# Patient Record
Sex: Male | Born: 1975
Health system: Southern US, Community
[De-identification: ages and names within clinical notes are randomized; demographics above are authoritative.]

## PROBLEM LIST (undated history)

## (undated) DIAGNOSIS — F410 Panic disorder [episodic paroxysmal anxiety] without agoraphobia: Secondary | ICD-10-CM

## (undated) DIAGNOSIS — G43909 Migraine, unspecified, not intractable, without status migrainosus: Secondary | ICD-10-CM

## (undated) DIAGNOSIS — N200 Calculus of kidney: Secondary | ICD-10-CM

## (undated) DIAGNOSIS — F419 Anxiety disorder, unspecified: Secondary | ICD-10-CM

## (undated) DIAGNOSIS — K219 Gastro-esophageal reflux disease without esophagitis: Secondary | ICD-10-CM

## (undated) HISTORY — PX: APPENDECTOMY: SHX54

## (undated) HISTORY — DX: Anxiety disorder, unspecified: F41.9

## (undated) HISTORY — DX: Calculus of kidney: N20.0

## (undated) HISTORY — PX: CYST REMOVAL NECK: SHX6281

## (undated) HISTORY — PX: CYST REMOVAL HAND: SHX6279

## (undated) HISTORY — PX: LACERATION REPAIR: SHX5168

## (undated) HISTORY — DX: Migraine, unspecified, not intractable, without status migrainosus: G43.909

## (undated) HISTORY — DX: Gastro-esophageal reflux disease without esophagitis: K21.9

---

## 2000-03-31 ENCOUNTER — Emergency Department (HOSPITAL_COMMUNITY): Admission: EM | Admit: 2000-03-31 | Discharge: 2000-03-31 | Payer: Self-pay | Admitting: Emergency Medicine

## 2000-04-01 ENCOUNTER — Encounter: Payer: Self-pay | Admitting: Emergency Medicine

## 2000-08-27 ENCOUNTER — Encounter: Payer: Self-pay | Admitting: Emergency Medicine

## 2000-08-27 ENCOUNTER — Emergency Department (HOSPITAL_COMMUNITY): Admission: EM | Admit: 2000-08-27 | Discharge: 2000-08-27 | Payer: Self-pay | Admitting: Emergency Medicine

## 2002-01-02 ENCOUNTER — Emergency Department (HOSPITAL_COMMUNITY): Admission: EM | Admit: 2002-01-02 | Discharge: 2002-01-02 | Payer: Self-pay | Admitting: Emergency Medicine

## 2003-09-19 ENCOUNTER — Encounter (INDEPENDENT_AMBULATORY_CARE_PROVIDER_SITE_OTHER): Payer: Self-pay | Admitting: Specialist

## 2003-09-19 ENCOUNTER — Observation Stay (HOSPITAL_COMMUNITY): Admission: EM | Admit: 2003-09-19 | Discharge: 2003-09-20 | Payer: Self-pay | Admitting: Emergency Medicine

## 2007-02-16 ENCOUNTER — Ambulatory Visit (HOSPITAL_COMMUNITY): Admission: RE | Admit: 2007-02-16 | Discharge: 2007-02-16 | Payer: Self-pay | Admitting: Orthopedic Surgery

## 2008-04-16 ENCOUNTER — Emergency Department (HOSPITAL_COMMUNITY): Admission: EM | Admit: 2008-04-16 | Discharge: 2008-04-16 | Payer: Self-pay | Admitting: Emergency Medicine

## 2009-01-24 ENCOUNTER — Other Ambulatory Visit: Admission: RE | Admit: 2009-01-24 | Discharge: 2009-01-24 | Payer: Self-pay | Admitting: Otolaryngology

## 2009-02-09 ENCOUNTER — Encounter: Admission: RE | Admit: 2009-02-09 | Discharge: 2009-02-09 | Payer: Self-pay | Admitting: Family Medicine

## 2010-08-02 ENCOUNTER — Encounter
Admission: RE | Admit: 2010-08-02 | Discharge: 2010-08-02 | Payer: Self-pay | Source: Home / Self Care | Attending: Internal Medicine | Admitting: Internal Medicine

## 2010-10-31 ENCOUNTER — Other Ambulatory Visit: Payer: Self-pay | Admitting: Physical Medicine and Rehabilitation

## 2010-10-31 DIAGNOSIS — M545 Low back pain, unspecified: Secondary | ICD-10-CM

## 2010-10-31 DIAGNOSIS — G8929 Other chronic pain: Secondary | ICD-10-CM

## 2010-11-01 ENCOUNTER — Other Ambulatory Visit: Payer: Self-pay | Admitting: Physical Medicine and Rehabilitation

## 2010-11-01 DIAGNOSIS — Z139 Encounter for screening, unspecified: Secondary | ICD-10-CM

## 2010-11-04 ENCOUNTER — Ambulatory Visit
Admission: RE | Admit: 2010-11-04 | Discharge: 2010-11-04 | Disposition: A | Discharge status: Home / Self Care | Payer: BC Managed Care – PPO | Source: Ambulatory Visit | Attending: Physical Medicine and Rehabilitation | Admitting: Physical Medicine and Rehabilitation

## 2010-11-04 ENCOUNTER — Other Ambulatory Visit: Payer: Self-pay

## 2010-11-04 DIAGNOSIS — M545 Low back pain, unspecified: Secondary | ICD-10-CM

## 2010-11-04 DIAGNOSIS — G8929 Other chronic pain: Secondary | ICD-10-CM

## 2010-11-04 DIAGNOSIS — Z139 Encounter for screening, unspecified: Secondary | ICD-10-CM

## 2011-01-10 NOTE — Op Note (Signed)
NAME:  EASHAN, SCHIPANI                         ACCOUNT NO.:  [id number]   MEDICAL RECORD NO.:  [id number]                   PATIENT TYPE:  INP   LOCATION:  0103                                 FACILITY:  [facility]   PHYSICIAN:  Abigail Miyamoto, M.D.              DATE OF BIRTH:  Aug 25, 1976   DATE OF PROCEDURE:  09/19/2003  DATE OF DISCHARGE:                                 OPERATIVE REPORT   PREOPERATIVE DIAGNOSIS:  Acute appendicitis.   POSTOPERATIVE DIAGNOSIS:  Acute appendicitis.   OPERATION/PROCEDURE:  Laparoscopic appendectomy.   SURGEON:  Abigail Miyamoto, M.D.   ANESTHESIA:  General endotracheal anesthesia and 0.25% Marcaine.   ESTIMATED BLOOD LOSS:  Minimal.   INDICATIONS:  Devesh Monforte is a 35 year old gentleman who presented with 30  day history of right lower quadrant abdominal pain.  He was found to be  tender on physical examination.  CT scan of the abdomen revealed an  appendicolith and a dilated appendix with mild periappendiceal inflammation.   FINDINGS:  The patient was indeed found to have acute appendicitis with  appendicolith.  There was no evidence of perforation.   DESCRIPTION OF PROCEDURE:  The patient was brought to the operating room,  identified as Jordan Hawks.  He was placed supine on the operating room  table and general anesthesia was induced.  His abdomen was then prepped and  draped in the usual sterile fashion.  Using a #15 blade, a small transverse  incision was made below the umbilicus.  The incision was carried down to the  fascia which was then opened with the scalpel.  Hemostat was used to pass  into the peritoneal cavity.  Next, an 0 Vicryl pursestring was placed around  the fascial opening.  The Hasson port was placed through the opening and  insufflation of the abdomen was begun.  The 5 mm port was then placed in the  patient's right upper quadrant under direct vision.  A 12 mm port was then  placed in the patient's midline at the pubis.   This was done under direct  vision as well.  The appendix was found to be acutely inflamed with an  appendicolith at the base.  Minimal mobilization was needed to identify the  appendix.  The terminal ileum was stuck to the appendix and had to be  dissected free with both blunt dissection and the Harmonic scalpel.  The  mesoappendix was then taken down with the Harmonic scalpel.  Hemostasis  appeared to be achieved.  The base of the appendix was then easily  identified and transected with endoscopic GIA stapler.  Once the appendix  was transected, it was placed in an endosac and removed through the incision  at the umbilicus.  The appendiceal base was examined and the staple line was  found to have a small amount of bleeding which was controlled with the  electrocautery.  The right lower quadrant was then  thoroughly irrigated with  normal saline.  Hemostasis appeared to be achieved.  At this point all ports  were removed under direct vision and the abdomen was deflated.  The 0 Vicryl  was tied in place at the umbilicus closing the fascial defect.  A separate 0  Vicryl suture was placed at the fascial defect at the suprapubic port.  All  incisions had been anesthetized with 0.25% Marcaine and closed with 4-0  Monocryl subcuticular sutures.  Steri-Strips, gauze and tape were then  applied.  The patient tolerated the procedure well.  All sponge, needle and  instrument counts were correct at the end of the procedure.  The patient was  then extubated in the operating room and taken in stable condition to the  recovery room.                                               Abigail Miyamoto, M.D.    DB/MEDQ  D:  09/19/2003  T:  09/20/2003  Job:  [id number]

## 2011-01-10 NOTE — H&P (Signed)
NAME:  Ralph Lang, Ralph Lang                         ACCOUNT NO.:  [id number]   MEDICAL RECORD NO.:  [id number]                   PATIENT TYPE:  EMS   LOCATION:  ED                                   FACILITY:  [facility]   PHYSICIAN:  Abigail Miyamoto, M.D.              DATE OF BIRTH:  1976-03-05   DATE OF ADMISSION:  09/19/2003  DATE OF DISCHARGE:                                HISTORY & PHYSICAL   CHIEF COMPLAINT:  Abdominal pain.   HISTORY:  Mr. Ralph Lang is a 35 year old gentleman who presented to  physician's office with right lower quadrant abdominal pain which had been  occurring for three days.  It hurt worse with motion.  He has had some hot  flashes today but no real fevers or chills.  He has had no nausea or  vomiting.  He is moving his bowels well and passing his urine well.  He  denies chest pain or shortness of breath as well.  He describes the pain as  sharp and in the right lower quadrant.   PAST MEDICAL HISTORY:  1. Migraine headaches.  2. Depression.   MEDICATIONS:  1. Migraine headache medication.  2. Xanax.   ALLERGIES:  He has no known drug allergies.   PREVIOUS SURGICAL HISTORY:  Tendon repair on both wrists.   SOCIAL HISTORY:  He smokes a pack of cigarettes a day and drinks alcohol on  occasion.   REVIEW OF SYSTEMS:  Otherwise negative.   PHYSICAL EXAMINATION:  GENERAL:  Reveals a well-developed, well-nourished  gentleman in no acute distress.  He is well in appearance.  VITAL SIGNS:  Temperature is 97.6, blood pressure 139/80, pulse is 88,  respiratory rate is 20.  EYES:  He is anicteric.  Pupils are reactive bilaterally.  EARS/NOSE/MOUTH AND THROAT:  External ears and nose are normal.  Hearing is  normal.  Oropharynx is clear.  NECK:  Supple.  There is no cervical adenopathy.  There is no thyromegaly.  LUNGS:  Clear to auscultation bilaterally with normal respiratory effort.  CARDIOVASCULAR:  Regular, rate and rhythm.  There are no murmurs.  There is  no peripheral edema.  ABDOMEN:  Soft.  There is tenderness which is quite severe with guarding in  the right lower quadrant.  There is rebound.  There are no masses.  There  are no hernias.  There is no organomegaly.  EXTREMITIES:  Warm and well perfused.  There is no edema, clubbing or  cyanosis.   LABORATORY DATA:  The patient has a white blood count of 11.7 with 63%  neutrophils, hemoglobin is 15.3, platelets are 158.  Sodium and potassium  are 139 and 3.6.  BUN and creatinine are 12 and 0.9.   The patient had a CAT scan of the abdomen and pelvis which showed an  appendicolith with a dilated appendix and __________ inflammation consistent  with acute appendicitis.   IMPRESSION:  This  is a 35 year old gentleman with probable acute  appendicitis.   PLAN:  At this point would be to admit him to the hospital and take him to  the operating room for appendectomy.  I discussed the procedure with him in  detail.  We will try a laparoscopic technique.  I have discussed the risks  of surgery including bleeding, infection, need for conversion to open  surgery, etcetera.  At this point, he wishes to proceed.  Surgery must be  performed emergently.                                               Abigail Miyamoto, M.D.    DB/MEDQ  D:  09/19/2003  T:  09/19/2003  Job:  [id number]

## 2011-06-02 ENCOUNTER — Observation Stay (HOSPITAL_COMMUNITY)
Admission: EM | Admit: 2011-06-02 | Discharge: 2011-06-03 | Disposition: A | Discharge status: Home / Self Care | Payer: BC Managed Care – PPO | Attending: Cardiology | Admitting: Cardiology

## 2011-06-02 ENCOUNTER — Emergency Department (HOSPITAL_COMMUNITY): Payer: BC Managed Care – PPO

## 2011-06-02 DIAGNOSIS — F411 Generalized anxiety disorder: Secondary | ICD-10-CM | POA: Insufficient documentation

## 2011-06-02 DIAGNOSIS — G43909 Migraine, unspecified, not intractable, without status migrainosus: Secondary | ICD-10-CM | POA: Insufficient documentation

## 2011-06-02 DIAGNOSIS — R110 Nausea: Secondary | ICD-10-CM | POA: Insufficient documentation

## 2011-06-02 DIAGNOSIS — R079 Chest pain, unspecified: Secondary | ICD-10-CM

## 2011-06-02 DIAGNOSIS — R0609 Other forms of dyspnea: Secondary | ICD-10-CM | POA: Insufficient documentation

## 2011-06-02 DIAGNOSIS — R0789 Other chest pain: Principal | ICD-10-CM | POA: Insufficient documentation

## 2011-06-02 DIAGNOSIS — R0989 Other specified symptoms and signs involving the circulatory and respiratory systems: Secondary | ICD-10-CM | POA: Insufficient documentation

## 2011-06-02 DIAGNOSIS — F17200 Nicotine dependence, unspecified, uncomplicated: Secondary | ICD-10-CM | POA: Insufficient documentation

## 2011-06-02 DIAGNOSIS — E785 Hyperlipidemia, unspecified: Secondary | ICD-10-CM | POA: Insufficient documentation

## 2011-06-02 DIAGNOSIS — D72829 Elevated white blood cell count, unspecified: Secondary | ICD-10-CM | POA: Insufficient documentation

## 2011-06-02 LAB — CBC
HCT: 42.6 % (ref 39.0–52.0)
MCHC: 35.9 g/dL (ref 30.0–36.0)
RDW: 12.3 % (ref 11.5–15.5)

## 2011-06-02 LAB — CARDIAC PANEL(CRET KIN+CKTOT+MB+TROPI)
CK, MB: 2 ng/mL (ref 0.3–4.0)
Relative Index: INVALID (ref 0.0–2.5)
Total CK: 71 U/L (ref 7–232)
Troponin I: <0.3 ng/mL (ref <0.30)

## 2011-06-02 LAB — HEPATIC FUNCTION PANEL
Alkaline Phosphatase: 78 U/L (ref 39–117)
Bilirubin, Direct: <0.1 mg/dL (ref 0.0–0.3)
Total Bilirubin: 0.2 mg/dL — ABNORMAL LOW (ref 0.3–1.2)
Total Protein: 6.8 g/dL (ref 6.0–8.3)

## 2011-06-02 LAB — DIFFERENTIAL
Basophils Absolute: 0 10*3/uL (ref 0.0–0.1)
Lymphs Abs: 2.7 10*3/uL (ref 0.7–4.0)
Monocytes Absolute: 0.9 10*3/uL (ref 0.1–1.0)

## 2011-06-02 LAB — BASIC METABOLIC PANEL
CO2: 26 mEq/L (ref 19–32)
Calcium: 9.6 mg/dL (ref 8.4–10.5)
Creatinine, Ser: 0.83 mg/dL (ref 0.50–1.35)

## 2011-06-02 LAB — POCT I-STAT TROPONIN I: Troponin i, poc: 0 ng/mL (ref 0.00–0.08)

## 2011-06-02 LAB — LIPASE, BLOOD: Lipase: 33 U/L (ref 11–59)

## 2011-06-02 LAB — HEMOGLOBIN A1C: Mean Plasma Glucose: 108 mg/dL (ref <117)

## 2011-06-02 LAB — CK TOTAL AND CKMB (NOT AT ARMC): Relative Index: INVALID (ref 0.0–2.5)

## 2011-06-03 LAB — CBC
MCHC: 35.2 g/dL (ref 30.0–36.0)
Platelets: 160 10*3/uL (ref 150–400)
RDW: 12.3 % (ref 11.5–15.5)

## 2011-06-03 LAB — BASIC METABOLIC PANEL
BUN: 17 mg/dL (ref 6–23)
CO2: 26 mEq/L (ref 19–32)
Calcium: 9.3 mg/dL (ref 8.4–10.5)
GFR calc non Af Amer: >90 mL/min (ref >90)
Glucose, Bld: 92 mg/dL (ref 70–99)

## 2011-06-03 LAB — LIPID PANEL
LDL Cholesterol: UNDETERMINED mg/dL (ref 0–99)
VLDL: UNDETERMINED mg/dL (ref 0–40)

## 2011-06-03 LAB — CARDIAC PANEL(CRET KIN+CKTOT+MB+TROPI)
CK, MB: 2.1 ng/mL (ref 0.3–4.0)
Total CK: 70 U/L (ref 7–232)

## 2011-06-08 NOTE — H&P (Addendum)
Ralph Lang, Ralph Lang NO.:  [id number]  MEDICAL RECORD NO.:  [id number]  LOCATION:  3705                         FACILITY:  [facility]  PHYSICIAN:  Madolyn Frieze. Jens Som, MD, FACCDATE OF BIRTH:  Jul 22, 1976  DATE OF ADMISSION:  06/02/2011 DATE OF DISCHARGE:                             HISTORY & PHYSICAL   PRIMARY CARDIOLOGIST:  New.  The patient remotely saw [facility], records are unavailable.  PRIMARY MEDICAL DOCTOR:  Dr. Evelene Croon manages his anxiety.  CHIEF COMPLAINT:  Chest pain.  HISTORY OF PRESENT ILLNESS:  Ralph Lang is a 35 year old gentleman with no prior cardiac history but history of anxiety and migraine headaches who woke up this morning with his whole chest hurting with associated left arm pain.  He felt somewhat lightheaded, oozy, and nauseous.  He describes the pain as an aching sensation, and it was not associated with any shortness of breath or diaphoresis.  However, he did feel a sense of increased anxiety.  He has had this before, in fact on a regular basis 1-2 times a week with no particular relation to exertion. He works in the [employer] and denies any chest pain or shortness of breath and doing physical labor.  The pain typically lasts 30-45 minutes, usually occurs after lunch when starting back at work.  They can also happen while lying around the house.  It is relieved by calming himself and taking Xanax.  He went to [facility] this morning and due to his complaints of chest pain, was sent to the ER.  Sublingual nitroglycerin brought his pain down.  He also received Zofran that helped.  He is currently pain free and without complaints.  EKG demonstrate nonspecific ST-T changes, not particularly changed from prior.  Pain is not positional or worse with inspiration.  PAST MEDICAL HISTORY: 1. Migraine headaches, under good control, followed in the headache     clinic. 2. Anxiety. 3. Chest pain evaluation with Holter for 1 week at  [facility]     [facility] per patient, within normal limits. 4. Ongoing tobacco use. 5. 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  SURGICAL HISTORY:  Appendectomy.  MEDICATIONS: 1. Cymbalta 30 mg daily. 2. Xanax 2 mg t.i.d. p.r.n.  ALLERGIES:  No known drug allergies.  SOCIAL HISTORY:  Ralph Lang lives in [place].  He is married without any children.  He works for [employer].  He has smoked since age 73.  He does not get any regular exercise but does work laborious job and rides 4 wheelers without any symptoms.  He denies any alcohol or drug use.  FAMILY HISTORY:  Positive for hypertension.  There is no history of coronary artery disease in his family.  His father and siblings are in good health.  REVIEW OF SYSTEMS:  No fever, shortness of breath, edema, palpitations, syncope, vomiting, bright red blood per rectum, melena, or hematemesis. All other systems were reviewed and otherwise negative.  LABORATORY DATA:  WBC 12.7, hemoglobin 15.3, hematocrit 42.6, platelet count 142,000.  Troponin is negative.  BMET is pending.  RADIOLOGIC STUDIES: 1. Chest x-ray:  No acute cardiopulmonary  process. 2. EKG:  Normal sinus rhythm with diffuse ST elevation, question     repolarization abnormality in II, aVF, V3 through V6 similar in     2003 except now upright in V2.  PHYSICAL EXAMINATION:  VITAL SIGNS:  Temperature 98, pulse 75, respirations 16, blood pressure 119/72, pulse ox 97% on room air. GENERAL:  This is a pleasant white male in no acute distress. HEENT:  Normocephalic, atraumatic with extraocular movements intact. Clear sclerae.  Nares are without discharge. NECK:  Supple without carotid bruits. HEART:  Auscultation of the heart reveals regular rate and rhythm with S1, S2 without murmurs, rubs, or gallops. LUNGS:  Clear to auscultation bilaterally without wheezes, rales, or rhonchi. ABDOMEN:  Soft, nontender,  nondistended.  Positive bowel sounds. EXTREMITIES:  Warm and dry without edema.  He has 2+ pedal pulses bilaterally. NEUROLOGIC:  He is alert and oriented x3.  Responds to questions appropriately.  ASSESSMENT/PLAN:  The patient was seen and examined by Dr. Jens Som and myself.  Ralph Lang is a 35 year old gentleman with a history of anxiety and tobacco abuse who presents with an episode of chest pain for the past 6-7 years, averaging twice per week, substernal with radiation to the left upper extremity with associated nausea, shortness of breath, and diaphoresis.  He has an increased incidence with stressful situations and it is typically relieved with Xanax.  He did get relief from nitroglycerin en route to the ER.  He is presently pain-free. Chest pain is felt atypical, possibly related to anxiety at this time. At this time, we will admit him to the hospital and rule out myocardial infarction.  If his enzymes remain negative, we will plan for an outpatient stress test.  He has been counseled regarding his tobacco discontinuation.  The significance of his white blood cell count is not known at this time.  Given his history of prior lymph node enlargement, he may benefit from outpatient followup closely at discharge.  We will recheck CBC in the morning.     Dayna Dunn, P.A.C.   ______________________________ Madolyn Frieze. Jens Som, MD, [facility]    DD/MEDQ  D:  06/02/2011  T:  06/02/2011  Job:  [id number]  cc:   Madolyn Frieze. Jens Som, MD, [facility] Milagros Evener, M.D.  Electronically Signed by Ronie Spies  on 06/08/2011 03:10:42 PM Electronically Signed by Olga Millers MD [facility] on 06/08/2011 05:37:17 PM

## 2011-06-08 NOTE — Discharge Summary (Addendum)
[name], EMPEY NO.:  [id number]  MEDICAL RECORD NO.:  [id number]  LOCATION:  3705                         FACILITY:  [facility]  PHYSICIAN:  Madolyn Frieze. Jens Som, MD, FACCDATE OF BIRTH:  1976-07-09  DATE OF ADMISSION:  06/02/2011 DATE OF DISCHARGE:  06/03/2011                              DISCHARGE SUMMARY   DISCHARGE DIAGNOSES: 1. Chest pain, felt atypical.     a.     Ruled out for myocardial infarction.     b.     For exercise treadmill test next week. 2. Dyslipidemia with elevated triglycerides and low HDL at 30. 3. Anxiety. 4. Ongoing tobacco abuse. 5. Migraine headaches, under control, followed by the [facility]. 6. Leukocytosis of unclear etiology.     a.     The patient has a history of lymph node biopsy in 2010,      instructed to follow up with PCP for recheck. 7. Status post appendectomy.  HOSPITAL COURSE:  Mr. Ralph Lang is a 35 year old gentleman with no prior history of coronary artery disease with a history of anxiety and migraine headaches who has complained of intermittent chest pain 1-2 times a week for the last several years.  He was asleep, when he woke up he had a sensation that his whole chest was hurting with associated left arm pain.  He denied any shortness of breath or any exertional symptoms. Typically when he has this discomfort, it lasts 30-45 minutes after lunch when starting back at work.  It is relieved by Xanax.  In the ER, it was possibly relieved by nitroglycerin.  EKG was without acute changes and cardiac enzymes were negative.  He was admitted to the hospital for rule out and cardiac markers remained negative.  He did have mild leukocytosis of 12.7 that came down to 11.6 with no obvious source of infection.  His chest pain was felt largely atypical, possibly related to anxiety.  On day of discharge, he is feeling better.  Dr. Jens Som has seen and examined him today and feels he is stable for discharge with an ETT and  followup with his PCP as an outpatient.  DISCHARGE LABORATORY DATA:  WBC 11.6, hemoglobin 13.8, hematocrit 44.9, and platelet count 160.  Sodium 138, potassium 4.2, chloride 103, CO2 26, glucose 92, BUN 17, and creatinine 1.  LFTs were normal.  Lipase is normal.  Cardiac enzymes negative x4.  Total cholesterol 207, triglycerides 5 with HDL 30, LDL not calculated.  TSH 1.050.  STUDIES:  Chest x-ray, June 02, 2011, showed no acute cardiopulmonary process.  DISCHARGE MEDICATIONS: 1. Aspirin 81 mg daily. 2. Cymbalta 30 mg every morning. 3. Xanax 2 mg t.i.d. p.r.n. anxiety.  DISPOSITION:  Mr. Ralph Lang will be discharged in stable condition to home.  He is to increase activity slowly.  Dr. Jens Som would like him to pay his particular attention to his diet given his elevated triglyceride level.  He will have an ETT at [facility] on June 12, 2011 at 2:00 p.m.  He is also to follow up with his PCP for a recheck of his white blood cell count given his history of lymph node biopsy.  DURATION OF  DISCHARGE ENCOUNTER:  Greater than 30 minutes including physician and PA time.     Dayna Dunn, P.A.C.   ______________________________ Madolyn Frieze. Jens Som, MD, [facility]    DD/MEDQ  D:  06/03/2011  T:  06/03/2011  Job:  [id number]  Electronically Signed by Ronie Spies  on 06/08/2011 03:10:45 PM Electronically Signed by Olga Millers MD [facility] on 06/08/2011 05:37:20 PM

## 2011-06-12 ENCOUNTER — Encounter: Payer: BC Managed Care – PPO | Admitting: Physician Assistant

## 2013-07-26 ENCOUNTER — Ambulatory Visit: Payer: No Typology Code available for payment source

## 2013-07-26 ENCOUNTER — Ambulatory Visit (INDEPENDENT_AMBULATORY_CARE_PROVIDER_SITE_OTHER): Payer: No Typology Code available for payment source | Admitting: Internal Medicine

## 2013-07-26 VITALS — BP 116/74 | HR 69 | Temp 98.4°F | Resp 17 | Ht 72.0 in | Wt 164.0 lb

## 2013-07-26 DIAGNOSIS — F17200 Nicotine dependence, unspecified, uncomplicated: Secondary | ICD-10-CM

## 2013-07-26 DIAGNOSIS — R5381 Other malaise: Secondary | ICD-10-CM

## 2013-07-26 DIAGNOSIS — F419 Anxiety disorder, unspecified: Secondary | ICD-10-CM

## 2013-07-26 DIAGNOSIS — M542 Cervicalgia: Secondary | ICD-10-CM

## 2013-07-26 DIAGNOSIS — G43909 Migraine, unspecified, not intractable, without status migrainosus: Secondary | ICD-10-CM

## 2013-07-26 DIAGNOSIS — R0789 Other chest pain: Secondary | ICD-10-CM

## 2013-07-26 DIAGNOSIS — F411 Generalized anxiety disorder: Secondary | ICD-10-CM

## 2013-07-26 DIAGNOSIS — R071 Chest pain on breathing: Secondary | ICD-10-CM

## 2013-07-26 LAB — COMPREHENSIVE METABOLIC PANEL
ALT: 16 U/L (ref 0–53)
AST: 18 U/L (ref 0–37)
Albumin: 4.6 g/dL (ref 3.5–5.2)
Alkaline Phosphatase: 66 U/L (ref 39–117)
Calcium: 10.1 mg/dL (ref 8.4–10.5)
Chloride: 106 mEq/L (ref 96–112)
Potassium: 4.6 mEq/L (ref 3.5–5.3)

## 2013-07-26 LAB — POCT CBC
Hemoglobin: 15 g/dL (ref 14.1–18.1)
Lymph, poc: 3.8 — AB (ref 0.6–3.4)
MCHC: 31.7 g/dL — AB (ref 31.8–35.4)
MID (cbc): 0.8 (ref 0–0.9)
MPV: 11.7 fL (ref 0–99.8)
POC Granulocyte: 8.2 — AB (ref 2–6.9)
POC LYMPH PERCENT: 29.9 %L (ref 10–50)
POC MID %: 5.9 %M (ref 0–12)
Platelet Count, POC: 153 10*3/uL (ref 142–424)
RDW, POC: 12.8 %

## 2013-07-26 LAB — VITAMIN B12: Vitamin B-12: 602 pg/mL (ref 211–911)

## 2013-07-26 LAB — POCT URINALYSIS DIPSTICK
Leukocytes, UA: NEGATIVE
Nitrite, UA: NEGATIVE
Protein, UA: NEGATIVE
Urobilinogen, UA: 0.2
pH, UA: 6

## 2013-07-26 LAB — LIPID PANEL
HDL: 32 mg/dL — ABNORMAL LOW (ref >39)
LDL Cholesterol: 102 mg/dL — ABNORMAL HIGH (ref 0–99)
VLDL: 25 mg/dL (ref 0–40)

## 2013-07-26 LAB — POCT UA - MICROSCOPIC ONLY
Casts, Ur, LPF, POC: NEGATIVE
Crystals, Ur, HPF, POC: NEGATIVE
Yeast, UA: NEGATIVE

## 2013-07-26 MED ORDER — SUMATRIPTAN SUCCINATE 100 MG PO TABS: 100.0000 mg | ORAL_TABLET | ORAL | Status: DC | PRN

## 2013-07-26 MED ORDER — KETOROLAC TROMETHAMINE 60 MG/2ML IM SOLN
60.0000 mg | Freq: Once | INTRAMUSCULAR | Status: AC
  Administered 2013-07-26: 60 mg via INTRAMUSCULAR

## 2013-07-26 MED ORDER — AZITHROMYCIN 500 MG PO TABS: 500.0000 mg | ORAL_TABLET | Freq: Every day | ORAL | Status: DC

## 2013-07-26 MED ORDER — AMITRIPTYLINE HCL 25 MG PO TABS: 25.0000 mg | ORAL_TABLET | Freq: Every day | ORAL | Status: DC

## 2013-07-26 NOTE — Patient Instructions (Addendum)
Smoking Cessation Quitting smoking is important to your health and has many advantages. However, it is not always easy to quit since nicotine is a very addictive drug. Often times, people try 3 times or more before being able to quit. This document explains the best ways for you to prepare to quit smoking. Quitting takes hard work and a lot of effort, but you can do it. ADVANTAGES OF QUITTING SMOKING  You will live longer, feel better, and live better.  Your body will feel the impact of quitting smoking almost immediately.  Within 20 minutes, blood pressure decreases. Your pulse returns to its normal level.  After 8 hours, carbon monoxide levels in the blood return to normal. Your oxygen level increases.  After 24 hours, the chance of having a heart attack starts to decrease. Your breath, hair, and body stop smelling like smoke.  After 48 hours, damaged nerve endings begin to recover. Your sense of taste and smell improve.  After 72 hours, the body is virtually free of nicotine. Your bronchial tubes relax and breathing becomes easier.  After 2 to 12 weeks, lungs can hold more air. Exercise becomes easier and circulation improves.  The risk of having a heart attack, stroke, cancer, or lung disease is greatly reduced.  After 1 year, the risk of coronary heart disease is cut in half.  After 5 years, the risk of stroke falls to the same as a nonsmoker.  After 10 years, the risk of lung cancer is cut in half and the risk of other cancers decreases significantly.  After 15 years, the risk of coronary heart disease drops, usually to the level of a nonsmoker.  If you are pregnant, quitting smoking will improve your chances of having a healthy baby.  The people you live with, especially any children, will be healthier.  You will have extra money to spend on things other than cigarettes. QUESTIONS TO THINK ABOUT BEFORE ATTEMPTING TO QUIT You may want to talk about your answers with your  caregiver.  Why do you want to quit?  If you tried to quit in the past, what helped and what did not?  What will be the most difficult situations for you after you quit? How will you plan to handle them?  Who can help you through the tough times? Your family? Friends? A caregiver?  What pleasures do you get from smoking? What ways can you still get pleasure if you quit? Here are some questions to ask your caregiver:  How can you help me to be successful at quitting?  What medicine do you think would be best for me and how should I take it?  What should I do if I need more help?  What is smoking withdrawal like? How can I get information on withdrawal? GET READY  Set a quit date.  Change your environment by getting rid of all cigarettes, ashtrays, matches, and lighters in your home, car, or work. Do not let people smoke in your home.  Review your past attempts to quit. Think about what worked and what did not. GET SUPPORT AND ENCOURAGEMENT You have a better chance of being successful if you have help. You can get support in many ways.  Tell your family, friends, and co-workers that you are going to quit and need their support. Ask them not to smoke around you.  Get individual, group, or telephone counseling and support. Programs are available at local hospitals and health centers. Call your local health department for   information about programs in your area.  Spiritual beliefs and practices may help some smokers quit.  Download a "quit meter" on your computer to keep track of quit statistics, such as how long you have gone without smoking, cigarettes not smoked, and money saved.  Get a self-help book about quitting smoking and staying off of tobacco. LEARN NEW SKILLS AND BEHAVIORS  Distract yourself from urges to smoke. Talk to someone, go for a walk, or occupy your time with a task.  Change your normal routine. Take a different route to work. Drink tea instead of coffee.  Eat breakfast in a different place.  Reduce your stress. Take a hot bath, exercise, or read a book.  Plan something enjoyable to do every day. Reward yourself for not smoking.  Explore interactive web-based programs that specialize in helping you quit. GET MEDICINE AND USE IT CORRECTLY Medicines can help you stop smoking and decrease the urge to smoke. Combining medicine with the above behavioral methods and support can greatly increase your chances of successfully quitting smoking.  Nicotine replacement therapy helps deliver nicotine to your body without the negative effects and risks of smoking. Nicotine replacement therapy includes nicotine gum, lozenges, inhalers, nasal sprays, and skin patches. Some may be available over-the-counter and others require a prescription.  Antidepressant medicine helps people abstain from smoking, but how this works is unknown. This medicine is available by prescription.  Nicotinic receptor partial agonist medicine simulates the effect of nicotine in your brain. This medicine is available by prescription. Ask your caregiver for advice about which medicines to use and how to use them based on your health history. Your caregiver will tell you what side effects to look out for if you choose to be on a medicine or therapy. Carefully read the information on the package. Do not use any other product containing nicotine while using a nicotine replacement product.  RELAPSE OR DIFFICULT SITUATIONS Most relapses occur within the first 3 months after quitting. Do not be discouraged if you start smoking again. Remember, most people try several times before finally quitting. You may have symptoms of withdrawal because your body is used to nicotine. You may crave cigarettes, be irritable, feel very hungry, cough often, get headaches, or have difficulty concentrating. The withdrawal symptoms are only temporary. They are strongest when you first quit, but they will go away within  10 14 days. To reduce the chances of relapse, try to:  Avoid drinking alcohol. Drinking lowers your chances of successfully quitting.  Reduce the amount of caffeine you consume. Once you quit smoking, the amount of caffeine in your body increases and can give you symptoms, such as a rapid heartbeat, sweating, and anxiety.  Avoid smokers because they can make you want to smoke.  Do not let weight gain distract you. Many smokers will gain weight when they quit, usually less than 10 pounds. Eat a healthy diet and stay active. You can always lose the weight gained after you quit.  Find ways to improve your mood other than smoking. FOR MORE INFORMATION  www.smokefree.gov  Document Released: 08/05/2001 Document Revised: 02/10/2012 Document Reviewed: 11/20/2011 [employer] Patient Information 2014 [employer].  Migraine Headache A migraine headache is an intense, throbbing pain on one or both sides of your head. A migraine can last for 30 minutes to several hours. CAUSES  The exact cause of a migraine headache is not always known. However, a migraine may be caused when nerves in the brain become irritated and release chemicals   that cause inflammation. This causes pain. SYMPTOMS  Pain on one or both sides of your head.  Pulsating or throbbing pain.  Severe pain that prevents daily activities.  Pain that is aggravated by any physical activity.  Nausea, vomiting, or both.  Dizziness.  Pain with exposure to bright lights, loud noises, or activity.  General sensitivity to bright lights, loud noises, or smells. Before you get a migraine, you may get warning signs that a migraine is coming (aura). An aura may include:  Seeing flashing lights.  Seeing bright spots, halos, or zig-zag lines.  Having tunnel vision or blurred vision.  Having feelings of numbness or tingling.  Having trouble talking.  Having muscle weakness. MIGRAINE  TRIGGERS  Alcohol.  Smoking.  Stress.  Menstruation.  Aged cheeses.  Foods or drinks that contain nitrates, glutamate, aspartame, or tyramine.  Lack of sleep.  Chocolate.  Caffeine.  Hunger.  Physical exertion.  Fatigue.  Medicines used to treat chest pain (nitroglycerine), birth control pills, estrogen, and some blood pressure medicines. DIAGNOSIS  A migraine headache is often diagnosed based on:  Symptoms.  Physical examination.  A CT scan or MRI of your head. TREATMENT Medicines may be given for pain and nausea. Medicines can also be given to help prevent recurrent migraines.  HOME CARE INSTRUCTIONS  Only take over-the-counter or prescription medicines for pain or discomfort as directed by your caregiver. The use of long-term narcotics is not recommended.  Lie down in a dark, quiet room when you have a migraine.  Keep a journal to find out what may trigger your migraine headaches. For example, write down:  What you eat and drink.  How much sleep you get.  Any change to your diet or medicines.  Limit alcohol consumption.  Quit smoking if you smoke.  Get 7 to 9 hours of sleep, or as recommended by your caregiver.  Limit stress.  Keep lights dim if bright lights bother you and make your migraines worse. SEEK IMMEDIATE MEDICAL CARE IF:   Your migraine becomes severe.  You have a fever.  You have a stiff neck.  You have vision loss.  You have muscular weakness or loss of muscle control.  You start losing your balance or have trouble walking.  You feel faint or pass out.  You have severe symptoms that are different from your first symptoms. MAKE SURE YOU:   Understand these instructions.  Will watch your condition.  Will get help right away if you are not doing well or get worse. Document Released: 08/11/2005 Document Revised: 11/03/2011 Document Reviewed: 08/01/2011 [employer] Patient Information 2014 [employer].  

## 2013-07-26 NOTE — Progress Notes (Signed)
Subjective:    Patient ID: Ralph Lang, male    DOB: September 18, 1975, 37 y.o.   MRN: [id number]  HPI CO persistent migrain HA, has had migraines since childhood and had full neuro eval by the headache center years ago with head scan. They no longer take his insurance, used to get imitrex shots at their office. Is a smoker Also co chest pain radiating into right neck, fhx of uncle with cabg and caritid disease. Works with [employer], uses lasers to cut, lots of fumes. Has migrain with aura, visual.  Sees Dr. Evelene Croon for anxiety and takes 2mg  hs nitely, she ordered many labs for w/up of his farigue Stat EKG is normal  Review of Systems     Objective:   Physical Exam  Vitals reviewed. Constitutional: He is oriented to person, place, and time. He appears well-developed and well-nourished. No distress.  Cardiovascular: Normal rate, regular rhythm, normal heart sounds and intact distal pulses.   No murmur heard. Pulmonary/Chest: Effort normal and breath sounds normal.  Abdominal: Soft.  Neurological: He is alert and oriented to person, place, and time. He has normal reflexes. He displays tremor. He displays no atrophy and normal reflexes. No cranial nerve deficit or sensory deficit. He exhibits normal muscle tone. He displays a negative Romberg sign. Coordination and gait normal. He displays no Babinski's sign on the right side. He displays no Babinski's sign on the left side.  Reflex Scores:      Patellar reflexes are 2+ on the right side and 2+ on the left side.      Achilles reflexes are 2+ on the right side and 2+ on the left side. No drift  Balance excellent one foot both     [facility] reading (PRIMARY) by  Dr Perrin Maltese cxr increased markings right middle lobe  Results for orders placed in visit on 07/26/13  POCT CBC      Result Value Range   WBC 12.8 (*) 4.6 - 10.2 K/uL   Lymph, poc 3.8 (*) 0.6 - 3.4   POC LYMPH PERCENT 29.9  10 - 50 %L   MID (cbc) 0.8  0 - 0.9   POC MID % 5.9  0 - 12 %M     POC Granulocyte 8.2 (*) 2 - 6.9   Granulocyte percent 64.2  37 - 80 %G   RBC 5.07  4.69 - 6.13 M/uL   Hemoglobin 15.0  14.1 - 18.1 g/dL   HCT, POC 40.9  81.1 - 53.7 %   MCV 93.2  80 - 97 fL   MCH, POC 29.6  27 - 31.2 pg   MCHC 31.7 (*) 31.8 - 35.4 g/dL   RDW, POC 91.4     Platelet Count, POC 153  142 - 424 K/uL   MPV 11.7  0 - 99.8 fL  POCT URINALYSIS DIPSTICK      Result Value Range   Color, UA yellow     Clarity, UA clear     Glucose, UA neg     Bilirubin, UA neg     Ketones, UA neg     Spec Grav, UA 1.015     Blood, UA neg     pH, UA 6.0     Protein, UA neg     Urobilinogen, UA 0.2     Nitrite, UA neg     Leukocytes, UA Negative    POCT UA - MICROSCOPIC ONLY      Result Value Range   WBC, Ur, HPF,  POC neg     RBC, urine, microscopic neg     Bacteria, U Microscopic neg     Mucus, UA neg     Epithelial cells, urine per micros 0-6     Crystals, Ur, HPF, POC neg     Casts, Ur, LPF, POC neg     Yeast, UA neg          Assessment & Plan:  Migraines persistent Nicotine addiction Fatigue/Leukocytosis/possible bronchitis Chronic anxiety is on 1-2 2mg  xanax per day from Dr. Evelene Croon

## 2013-07-27 ENCOUNTER — Telehealth: Payer: Self-pay | Admitting: Radiology

## 2013-07-27 ENCOUNTER — Encounter: Payer: Self-pay | Admitting: *Deleted

## 2013-07-27 ENCOUNTER — Other Ambulatory Visit: Payer: Self-pay | Admitting: Radiology

## 2013-07-27 LAB — VITAMIN D 25 HYDROXY (VIT D DEFICIENCY, FRACTURES): Vit D, 25-Hydroxy: 44 ng/mL (ref 30–89)

## 2013-07-27 NOTE — Telephone Encounter (Signed)
Please advise on the Urology referral for patient, what are you wanting them to evaluate?

## 2013-07-31 NOTE — Telephone Encounter (Signed)
Talk to me about this monday

## 2013-08-02 NOTE — Telephone Encounter (Signed)
Spoke to Dr Perrin Maltese, and he has asked me to call patient, called him. Left message for him to call me back.

## 2013-08-04 ENCOUNTER — Ambulatory Visit (INDEPENDENT_AMBULATORY_CARE_PROVIDER_SITE_OTHER): Payer: No Typology Code available for payment source | Admitting: Internal Medicine

## 2013-08-04 ENCOUNTER — Encounter: Payer: Self-pay | Admitting: Internal Medicine

## 2013-08-04 VITALS — BP 120/76 | HR 90 | Temp 98.0°F | Resp 16 | Ht 72.0 in | Wt 165.0 lb

## 2013-08-04 DIAGNOSIS — D72829 Elevated white blood cell count, unspecified: Secondary | ICD-10-CM

## 2013-08-04 DIAGNOSIS — A630 Anogenital (venereal) warts: Secondary | ICD-10-CM

## 2013-08-04 DIAGNOSIS — G43909 Migraine, unspecified, not intractable, without status migrainosus: Secondary | ICD-10-CM

## 2013-08-04 LAB — POCT CBC
Granulocyte percent: 66.9 %G (ref 37–80)
Hemoglobin: 15.9 g/dL (ref 14.1–18.1)
MCH, POC: 29.8 pg (ref 27–31.2)
MID (cbc): 0.8 (ref 0–0.9)
MPV: 12 fL (ref 0–99.8)
POC MID %: 8 %M (ref 0–12)
Platelet Count, POC: 137 10*3/uL — AB (ref 142–424)
RBC: 5.34 M/uL (ref 4.69–6.13)
WBC: 10.5 10*3/uL — AB (ref 4.6–10.2)

## 2013-08-04 NOTE — Telephone Encounter (Signed)
Please send to urology patient has bumps on his penis

## 2013-08-04 NOTE — Patient Instructions (Signed)
Leukocytosis Leukocytosis means you have more white blood cells than normal. White blood cells are made in your bone marrow. The main job of white blood cells is to fight infection. Having too many white blood cells is a common condition. It can develop as a result of many types of medical problems. CAUSES  In some cases, your bone marrow may be normal, but it is still making too many white blood cells. This could be the result of:  Infection.  Injury.  Physical stress.  Emotional stress.  Surgery.  Allergic reactions.  Tumors that do not start in the blood or bone marrow.  An inherited disease.  Certain medicines.  Pregnancy and labor. In other cases, you may have a bone marrow disorder that is causing your body to make too many white blood cells. Bone marrow disorders include:  Leukemia. This is a type of blood cancer.  Myeloproliferative disorders. These disorders cause blood cells to grow abnormally. SYMPTOMS  Some people have no symptoms. Others have symptoms due to the medical problem that is causing their leukocytosis. These symptoms may include:  Bleeding.  Bruising.  Fever.  Night sweats.  Repeated infections.  Weakness.  Weight loss. DIAGNOSIS  Leukocytosis is often found during blood tests that are done as part of a normal physical exam. Your caregiver will probably order other tests to help determine why you have too many white blood cells. These tests may include:  A complete blood count (CBC). This test measures all the types of blood cells in your body.  Chest X-rays, urine tests (urinalysis), or other tests to look for signs of infection.  Bone marrow aspiration. For this test, a needle is put into your bone. Cells from the bone marrow are removed through the needle. The cells are then examined under a microscope. TREATMENT  Treatment is usually not needed for leukocytosis. However, if a disorder is causing your leukocytosis, it will need to be  treated. Treatment may include:  Antibiotic medicines if you have a bacterial infection.  Bone marrow transplant. Your diseased bone marrow is replaced with healthy cells that will grow new bone marrow.  Chemotherapy. This is the use of drugs to kill cancer cells. HOME CARE INSTRUCTIONS  Only take over-the-counter or prescription medicines as directed by your caregiver.  Maintain a healthy weight. Ask your caregiver what weight is best for you.  Eat foods that are low in saturated fats and high in fiber. Eat plenty of fruits and vegetables.  Drink enough fluids to keep your urine clear or pale yellow.  Get 30 minutes of exercise at least 5 times a week. Check with your caregiver before starting a new exercise routine.  Limit caffeine and alcohol.  Do not smoke.  Keep all follow-up appointments as directed by your caregiver. SEEK MEDICAL CARE IF:  You feel weak or more tired than usual.  You develop chills, a cough, or nasal congestion.  You lose weight without trying.  You have night sweats.  You bruise easily. SEEK IMMEDIATE MEDICAL CARE IF:  You bleed more than normal.  You have chest pain.  You have trouble breathing.  You have a fever.  You have uncontrolled nausea or vomiting.  You feel dizzy or lightheaded. MAKE SURE YOU:  Understand these instructions.  Will watch your condition.  Will get help right away if you are not doing well or get worse. Document Released: 07/31/2011 Document Revised: 11/03/2011 Document Reviewed: 07/31/2011 [facility] Patient Information 2014 [place], [place]. Genital Warts Genital  warts are a sexually transmitted infection. They may appear as small bumps on the tissues of the genital area. CAUSES  Genital warts are caused by a virus called human papillomavirus (HPV). HPV is the most common sexually transmitted disease (STD) and infection of the sex organs. This infection is spread by having unprotected sex with an infected  person. It can be spread by vaginal, anal, and oral sex. Many people do not know they are infected. They may be infected for years without problems. However, even if they do not have problems, they can unknowingly pass the infection to their sexual partners. SYMPTOMS   Itching and irritation in the genital area.  Warts that bleed.  Painful sexual intercourse. DIAGNOSIS  Warts are usually recognized with the naked eye on the vagina, vulva, perineum, anus, and rectum. Certain tests can also diagnose genital warts, such as:  A Pap test.  A tissue sample (biopsy) exam.  Colposcopy. A magnifying tool is used to examine the vagina and cervix. The HPV cells will change color when certain solutions are used. TREATMENT  Warts can be removed by:  Applying certain chemicals, such as cantharidin or podophyllin.  Liquid nitrogen freezing (cryotherapy).  Immunotherapy with candida or trichophyton injections.  Laser treatment.  Burning with an electrified probe (electrocautery).  Interferon injections.  Surgery. PREVENTION  HPV vaccination can help prevent HPV infections that cause genital warts and that cause cancer of the cervix. It is recommended that the vaccination be given to people between the ages 7 to 38 years old. The vaccine might not work as well or might not work at all if you already have HPV. It should not be given to pregnant women. HOME CARE INSTRUCTIONS   It is important to follow your caregiver's instructions. The warts will not go away without treatment. Repeat treatments are often needed to get rid of warts. Even after it appears that the warts are gone, the normal tissue underneath often remains infected.  Do not try to treat genital warts with medicine used to treat hand warts. This type of medicine is strong and can burn the skin in the genital area, causing more damage.  Tell your past and current sexual partner(s) that you have genital warts. They may be infected  also and need treatment.  Avoid sexual contact while being treated.  Do not touch or scratch the warts. The infection may spread to other parts of your body.  Women with genital warts should have a cervical cancer check (Pap test) at least once a year. This type of cancer is slow-growing and can be cured if found early. Chances of developing cervical cancer are increased with HPV.  Inform your obstetrician about your warts in the event of pregnancy. This virus can be passed to the baby's respiratory tract. Discuss this with your caregiver.  Use a condom during sexual intercourse. Following treatment, the use of condoms will help prevent reinfection.  Ask your caregiver about using over-the-counter anti-itch creams. SEEK MEDICAL CARE IF:   Your treated skin becomes red, swollen, or painful.  You have a fever.  You feel generally ill.  You feel little lumps in and around your genital area.  You are bleeding or have painful sexual intercourse. MAKE SURE YOU:   Understand these instructions.  Will watch your condition.  Will get help right away if you are not doing well or get worse. Document Released: 08/08/2000 Document Revised: 11/03/2011 Document Reviewed: 02/17/2011 [facility] Patient Information 2014 [place], [place].

## 2013-08-04 NOTE — Progress Notes (Signed)
   Subjective:    Patient ID: Ralph Lang, male    DOB: 1975-08-26, 37 y.o.   MRN: [id number]  HPI  Patient is here for a follow up about his labs and xray from the 3rd of December Patient is on Xanax per scribed by Dr Evelene Croon and been feeling tired all the time and still feel fatigue at times and thought it was his tyroid. 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 The elavil has lessened his HAs, sleeping better. Review of Systems     Objective:   Physical Exam  Constitutional: He is oriented to person, place, and time. He appears well-developed and well-nourished.  HENT:  Head: Normocephalic.  Eyes: EOM are normal.  Pulmonary/Chest: Effort normal.  Musculoskeletal: Normal range of motion.  Neurological: He is alert and oriented to person, place, and time. He exhibits normal muscle tone. Coordination normal.  Psychiatric: He has a normal mood and affect. His behavior is normal.          Assessment & Plan:  CBC to be sure returned to normal May increase dose elavil to 50mg  hs if needed in about 2-3 weeks. Refer to urology for genital warts

## 2013-08-04 NOTE — Progress Notes (Signed)
   Subjective:    Patient ID: Ralph Lang, male    DOB: 11-02-1975, 37 y.o.   MRN: [id number]  HPI    Review of Systems     Objective:   Physical Exam        Assessment & Plan:

## 2013-11-24 ENCOUNTER — Other Ambulatory Visit: Payer: Self-pay | Admitting: Internal Medicine

## 2013-12-17 ENCOUNTER — Other Ambulatory Visit: Payer: Self-pay | Admitting: Internal Medicine

## 2014-04-17 ENCOUNTER — Ambulatory Visit (INDEPENDENT_AMBULATORY_CARE_PROVIDER_SITE_OTHER): Payer: No Typology Code available for payment source

## 2014-04-17 ENCOUNTER — Ambulatory Visit (INDEPENDENT_AMBULATORY_CARE_PROVIDER_SITE_OTHER): Payer: No Typology Code available for payment source | Admitting: Family Medicine

## 2014-04-17 VITALS — BP 128/88 | HR 78 | Temp 98.2°F | Resp 16 | Ht 73.0 in | Wt 156.0 lb

## 2014-04-17 DIAGNOSIS — M542 Cervicalgia: Secondary | ICD-10-CM

## 2014-04-17 DIAGNOSIS — T148XXA Other injury of unspecified body region, initial encounter: Secondary | ICD-10-CM

## 2014-04-17 DIAGNOSIS — M25521 Pain in right elbow: Secondary | ICD-10-CM

## 2014-04-17 DIAGNOSIS — G8929 Other chronic pain: Secondary | ICD-10-CM

## 2014-04-17 DIAGNOSIS — G43101 Migraine with aura, not intractable, with status migrainosus: Secondary | ICD-10-CM

## 2014-04-17 DIAGNOSIS — M545 Low back pain, unspecified: Secondary | ICD-10-CM

## 2014-04-17 DIAGNOSIS — M25529 Pain in unspecified elbow: Secondary | ICD-10-CM

## 2014-04-17 DIAGNOSIS — G43111 Migraine with aura, intractable, with status migrainosus: Secondary | ICD-10-CM

## 2014-04-17 MED ORDER — TOPIRAMATE 50 MG PO TABS: 50.0000 mg | ORAL_TABLET | Freq: Every day | ORAL | Status: DC

## 2014-04-17 MED ORDER — MELOXICAM 7.5 MG PO TABS: 7.5000 mg | ORAL_TABLET | Freq: Two times a day (BID) | ORAL | Status: DC | PRN

## 2014-04-17 MED ORDER — SUMATRIPTAN SUCCINATE 100 MG PO TABS: 100.0000 mg | ORAL_TABLET | ORAL | Status: DC | PRN
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
## 2014-04-18 ENCOUNTER — Other Ambulatory Visit: Payer: Self-pay | Admitting: Internal Medicine

## 2015-01-29 ENCOUNTER — Other Ambulatory Visit: Payer: Self-pay | Admitting: Internal Medicine

## 2015-01-29 DIAGNOSIS — H547 Unspecified visual loss: Secondary | ICD-10-CM

## 2015-01-30 ENCOUNTER — Encounter (HOSPITAL_COMMUNITY): Payer: Self-pay | Admitting: Emergency Medicine

## 2015-01-30 ENCOUNTER — Other Ambulatory Visit: Payer: Self-pay

## 2015-01-30 ENCOUNTER — Emergency Department (HOSPITAL_COMMUNITY)
Admission: EM | Admit: 2015-01-30 | Discharge: 2015-01-31 | Disposition: A | Discharge status: Home / Self Care | Payer: No Typology Code available for payment source | Attending: Emergency Medicine | Admitting: Emergency Medicine

## 2015-01-30 DIAGNOSIS — R112 Nausea with vomiting, unspecified: Secondary | ICD-10-CM | POA: Diagnosis present

## 2015-01-30 DIAGNOSIS — Z720 Tobacco use: Secondary | ICD-10-CM | POA: Insufficient documentation

## 2015-01-30 DIAGNOSIS — R197 Diarrhea, unspecified: Secondary | ICD-10-CM | POA: Insufficient documentation

## 2015-01-30 DIAGNOSIS — Z79899 Other long term (current) drug therapy: Secondary | ICD-10-CM | POA: Insufficient documentation

## 2015-01-30 DIAGNOSIS — G43909 Migraine, unspecified, not intractable, without status migrainosus: Secondary | ICD-10-CM | POA: Insufficient documentation

## 2015-01-30 DIAGNOSIS — R202 Paresthesia of skin: Secondary | ICD-10-CM | POA: Insufficient documentation

## 2015-01-30 DIAGNOSIS — F410 Panic disorder [episodic paroxysmal anxiety] without agoraphobia: Secondary | ICD-10-CM | POA: Diagnosis not present

## 2015-01-30 HISTORY — DX: Panic disorder (episodic paroxysmal anxiety): F41.0

## 2015-01-30 LAB — COMPREHENSIVE METABOLIC PANEL
ALT: 25 U/L (ref 17–63)
AST: 28 U/L (ref 15–41)
Albumin: 4.6 g/dL (ref 3.5–5.0)
Alkaline Phosphatase: 59 U/L (ref 38–126)
Anion gap: 14 (ref 5–15)
BUN: 15 mg/dL (ref 6–20)
CO2: 20 mmol/L — ABNORMAL LOW (ref 22–32)
Calcium: 10 mg/dL (ref 8.9–10.3)
Chloride: 106 mmol/L (ref 101–111)
Creatinine, Ser: 1.13 mg/dL (ref 0.61–1.24)
GFR calc Af Amer: >60 mL/min (ref >60)
GFR calc non Af Amer: >60 mL/min (ref >60)
Glucose, Bld: 133 mg/dL — ABNORMAL HIGH (ref 65–99)
Potassium: 3.4 mmol/L — ABNORMAL LOW (ref 3.5–5.1)
Sodium: 140 mmol/L (ref 135–145)
Total Bilirubin: 0.5 mg/dL (ref 0.3–1.2)
Total Protein: 7.6 g/dL (ref 6.5–8.1)

## 2015-01-30 LAB — CBC WITH DIFFERENTIAL/PLATELET
BASOS ABS: 0 10*3/uL (ref 0.0–0.1)
BASOS PCT: 0 % (ref 0–1)
Eosinophils Absolute: 0.3 10*3/uL (ref 0.0–0.7)
Eosinophils Relative: 2 % (ref 0–5)
HCT: 45.5 % (ref 39.0–52.0)
HEMOGLOBIN: 16.2 g/dL (ref 13.0–17.0)
LYMPHS PCT: 44 % (ref 12–46)
Lymphs Abs: 6.1 10*3/uL — ABNORMAL HIGH (ref 0.7–4.0)
MCH: 30.2 pg (ref 26.0–34.0)
MCHC: 35.6 g/dL (ref 30.0–36.0)
MCV: 84.9 fL (ref 78.0–100.0)
MONOS PCT: 9 % (ref 3–12)
Monocytes Absolute: 1.2 10*3/uL — ABNORMAL HIGH (ref 0.1–1.0)
NEUTROS ABS: 6.2 10*3/uL (ref 1.7–7.7)
Neutrophils Relative %: 45 % (ref 43–77)
Platelets: 189 10*3/uL (ref 150–400)
RBC: 5.36 MIL/uL (ref 4.22–5.81)
RDW: 12.7 % (ref 11.5–15.5)
WBC: 13.8 10*3/uL — AB (ref 4.0–10.5)

## 2015-01-30 MED ORDER — LOPERAMIDE HCL 2 MG PO CAPS
4.0000 mg | ORAL_CAPSULE | Freq: Once | ORAL | Status: AC
  Administered 2015-01-31: 4 mg via ORAL
  Filled 2015-01-30: qty 2

## 2015-01-30 MED ORDER — SODIUM CHLORIDE 0.9 % IV BOLUS (SEPSIS)
1000.0000 mL | Freq: Once | INTRAVENOUS | Status: AC
  Administered 2015-01-31: 1000 mL via INTRAVENOUS

## 2015-01-30 MED ORDER — ONDANSETRON HCL 4 MG/2ML IJ SOLN
4.0000 mg | Freq: Once | INTRAMUSCULAR | Status: AC
  Administered 2015-01-31: 4 mg via INTRAVENOUS
  Filled 2015-01-30: qty 2

## 2015-01-30 NOTE — ED Notes (Signed)
Pt. reports emesis onset last Saturday and panic attack today , diarrhea once yesterday , denies fever or chills. No abdominal pain .

## 2015-01-30 NOTE — ED Provider Notes (Signed)
CSN: [id number]     Arrival date & time 01/30/15  2033 History  This chart was scribed for Merryl Hacker, MD by Rayfield Citizen, ED Scribe. This patient was seen in room B17C/B17C and the patient's care was started at 11:44 PM.    Chief Complaint  Patient presents with  . Emesis  . Panic Attack   The history is provided by the patient. No language interpreter was used.     HPI Comments: AH BOTT is a 39 y.o. male who presents to the Emergency Department complaining of 3 days of vomiting, as well as several episodes of diarrhea throughout the past 5 days. Patient explains he experienced some numbness and paresthesias to his fingers on both hands this afternoon; he attributes these symptoms to dehydration but does note a panic attack just following the onset of this numbness. He states his numbness and paresthesias were unexpected for him; "I have panic attacks almost daily and I've never felt that before." He denies abdominal pain, fever, chills, dysuria, hematuria. He was seen at [facility] this morning and was given antibiotics to "clear up any intestinal issues", though he has not yet taken his first dose.  Patient states he has an ongoing history of anxiety. He takes xanax daily. He is an everyday smoker.   Past Medical History  Diagnosis Date  . Anxiety   . Migraine   . Panic attack    History reviewed. No pertinent past surgical history. Family History  Problem Relation Age of Onset  . Hypertension Mother   . Hypertension Father    History  Substance Use Topics  . Smoking status: Current Every Day Smoker  . Smokeless tobacco: Not on file  . Alcohol Use: No    Review of Systems  Constitutional: Negative.  Negative for fever.  Respiratory: Negative.  Negative for chest tightness and shortness of breath.   Cardiovascular: Negative.  Negative for chest pain.  Gastrointestinal: Positive for nausea, vomiting and diarrhea. Negative for abdominal pain.   Genitourinary: Negative.  Negative for dysuria.  Neurological: Positive for numbness. Negative for headaches.  Psychiatric/Behavioral: The patient is nervous/anxious.   All other systems reviewed and are negative.     Allergies  Review of patient's allergies indicates no known allergies.  Home Medications   Prior to Admission medications   Medication Sig Start Date End Date Taking? Authorizing Provider  alprazolam Duanne Moron) 2 MG tablet Take 2 mg by mouth 3 (three) times daily. 01/04/15  Yes Historical Provider, MD  amitriptyline (ELAVIL) 25 MG tablet TAKE 1 TABLET (25 MG TOTAL) BY MOUTH AT BEDTIME.   Yes Chelle Jeffery, PA-C  SUMAtriptan (IMITREX) 100 MG tablet Take 1 tablet (100 mg total) by mouth every 2 (two) hours as needed for migraine or headache. May repeat in 2 hours if headache persists or recurs. 04/17/14  Yes Thao P Le, DO  topiramate (TOPAMAX) 50 MG tablet Take 1 tablet (50 mg total) by mouth daily. 04/17/14  Yes Thao P Le, DO  loperamide (IMODIUM) 2 MG capsule Take 2 capsules (4 mg total) by mouth as needed for diarrhea or loose stools. 01/31/15   Merryl Hacker, MD  ondansetron (ZOFRAN ODT) 4 MG disintegrating tablet Take 1 tablet (4 mg total) by mouth every 8 (eight) hours as needed for nausea or vomiting. 01/31/15   Merryl Hacker, MD   BP 137/81 mmHg  Pulse 87  Temp(Src) 97.8 F (36.6 C) (Oral)  Resp 20  SpO2 100% Physical  Exam  Constitutional: He is oriented to person, place, and time. He appears well-developed and well-nourished.  HENT:  Head: Normocephalic and atraumatic.  Cardiovascular: Normal rate, regular rhythm and normal heart sounds.   No murmur heard. Pulmonary/Chest: Effort normal and breath sounds normal. No respiratory distress. He has no wheezes.  Abdominal: Soft. Bowel sounds are normal. There is no tenderness. There is no rebound.  Musculoskeletal: He exhibits no edema.  Neurological: He is alert and oriented to person, place, and time.  Skin:  Skin is warm and dry.  Psychiatric: He has a normal mood and affect.  Nursing note and vitals reviewed.   ED Course  Procedures   DIAGNOSTIC STUDIES: Oxygen Saturation is 99% on RA, normal by my interpretation.    COORDINATION OF CARE: 11:49 PM Discussed treatment plan with pt at bedside and pt agreed to plan.   Labs Review Labs Reviewed  CBC WITH DIFFERENTIAL/PLATELET - Abnormal; Notable for the following:    WBC 13.8 (*)    Lymphs Abs 6.1 (*)    Monocytes Absolute 1.2 (*)    All other components within normal limits  COMPREHENSIVE METABOLIC PANEL - Abnormal; Notable for the following:    Potassium 3.4 (*)    CO2 20 (*)    Glucose, Bld 133 (*)    All other components within normal limits  LIPASE, BLOOD    Imaging Review No results found.   EKG Interpretation None      MDM   Final diagnoses:  Nausea vomiting and diarrhea  Paresthesia   Patient presents with vomiting, diarrhea, and paresthesias. Paresthesias have resolved. Patient does have a history of anxiety and was reportedly hyperventilating upon arrival. Abdomen is soft. Vital signs reassuring. Basic lab work is largely reassuring. Mild leukocytosis of unclear significance and mild hypokalemia. This was replaced. Patient was given fluids, Zofran, and Imodium. On multiple rechecks, he is comfortable and has no signs of peritonitis. Suspect viral syndrome.  Discussed this with patient. Patient was given strict return precautions.  After history, exam, and medical workup I feel the patient has been appropriately medically screened and is safe for discharge home. Pertinent diagnoses were discussed with the patient. Patient was given return precautions.  I personally performed the services described in this documentation, which was scribed in my presence. The recorded information has been reviewed and is accurate.       Merryl Hacker, MD 01/31/15 [phone]

## 2015-01-31 LAB — LIPASE, BLOOD: LIPASE: 24 U/L (ref 22–51)

## 2015-01-31 LAB — PATHOLOGIST SMEAR REVIEW: Path Review: REACTIVE

## 2015-01-31 MED ORDER — ONDANSETRON 4 MG PO TBDP
4.0000 mg | ORAL_TABLET | Freq: Three times a day (TID) | ORAL | Status: DC | PRN
Start: 2015-01-31 — End: 2016-07-10

## 2015-01-31 MED ORDER — POTASSIUM CHLORIDE CRYS ER 20 MEQ PO TBCR
40.0000 meq | EXTENDED_RELEASE_TABLET | Freq: Once | ORAL | Status: AC
  Administered 2015-01-31: 40 meq via ORAL
  Filled 2015-01-31: qty 2

## 2015-01-31 MED ORDER — LOPERAMIDE HCL 2 MG PO CAPS: 4.0000 mg | ORAL_CAPSULE | ORAL | Status: DC | PRN

## 2015-01-31 NOTE — Discharge Instructions (Signed)
You were seen today for vomiting and diarrhea. You may have a viral illness. Her lab work is reassuring. You should take Imodium at home for loose stools and will be given a prescription for Zofran. Follow up with her primary physician if symptoms do not improve.  Viral Gastroenteritis Viral gastroenteritis is also known as stomach flu. This condition affects the stomach and intestinal tract. It can cause sudden diarrhea and vomiting. The illness typically lasts 3 to 8 days. Most people develop an immune response that eventually gets rid of the virus. While this natural response develops, the virus can make you quite ill. CAUSES  Many different viruses can cause gastroenteritis, such as rotavirus or noroviruses. You can catch one of these viruses by consuming contaminated food or water. You may also catch a virus by sharing utensils or other personal items with an infected person or by touching a contaminated surface. SYMPTOMS  The most common symptoms are diarrhea and vomiting. These problems can cause a severe loss of body fluids (dehydration) and a body salt (electrolyte) imbalance. Other symptoms may include:  Fever.  Headache.  Fatigue.  Abdominal pain. DIAGNOSIS  Your caregiver can usually diagnose viral gastroenteritis based on your symptoms and a physical exam. A stool sample may also be taken to test for the presence of viruses or other infections. TREATMENT  This illness typically goes away on its own. Treatments are aimed at rehydration. The most serious cases of viral gastroenteritis involve vomiting so severely that you are not able to keep fluids down. In these cases, fluids must be given through an intravenous line (IV). HOME CARE INSTRUCTIONS   Drink enough fluids to keep your urine clear or pale yellow. Drink small amounts of fluids frequently and increase the amounts as tolerated.  Ask your caregiver for specific rehydration instructions.  Avoid:  Foods high in  sugar.  Alcohol.  Carbonated drinks.  Tobacco.  Juice.  Caffeine drinks.  Extremely hot or cold fluids.  Fatty, greasy foods.  Too much intake of anything at one time.  Dairy products until 24 to 48 hours after diarrhea stops.  You may consume probiotics. Probiotics are active cultures of beneficial bacteria. They may lessen the amount and number of diarrheal stools in adults. Probiotics can be found in yogurt with active cultures and in supplements.  Wash your hands well to avoid spreading the virus.  Only take over-the-counter or prescription medicines for pain, discomfort, or fever as directed by your caregiver. Do not give aspirin to children. Antidiarrheal medicines are not recommended.  Ask your caregiver if you should continue to take your regular prescribed and over-the-counter medicines.  Keep all follow-up appointments as directed by your caregiver. SEEK IMMEDIATE MEDICAL CARE IF:   You are unable to keep fluids down.  You do not urinate at least once every 6 to 8 hours.  You develop shortness of breath.  You notice blood in your stool or vomit. This may look like coffee grounds.  You have abdominal pain that increases or is concentrated in one small area (localized).  You have persistent vomiting or diarrhea.  You have a fever.  The patient is a child younger than 3 months, and he or she has a fever.  The patient is a child older than 3 months, and he or she has a fever and persistent symptoms.  The patient is a child older than 3 months, and he or she has a fever and symptoms suddenly get worse.  The patient is  a baby, and he or she has no tears when crying. MAKE SURE YOU:   Understand these instructions.  Will watch your condition.  Will get help right away if you are not doing well or get worse. Document Released: 08/11/2005 Document Revised: 11/03/2011 Document Reviewed: 05/28/2011 [facility] Patient Information 2015 [place], [place]. This  information is not intended to replace advice given to you by your health care provider. Make sure you discuss any questions you have with your health care provider. Paresthesia Paresthesia is an abnormal burning or prickling sensation. This sensation is generally felt in the hands, arms, legs, or feet. However, it may occur in any part of the body. It is usually not painful. The feeling may be described as:  Tingling or numbness.  "Pins and needles."  Skin crawling.  Buzzing.  Limbs "falling asleep."  Itching. Most people experience temporary (transient) paresthesia at some time in their lives. CAUSES  Paresthesia may occur when you breathe too quickly (hyperventilation). It can also occur without any apparent cause. Commonly, paresthesia occurs when pressure is placed on a nerve. The feeling quickly goes away once the pressure is removed. For some people, however, paresthesia is a long-lasting (chronic) condition caused by an underlying disorder. The underlying disorder may be:  A traumatic, direct injury to nerves. Examples include a:  Broken (fractured) neck.  Fractured skull.  A disorder affecting the brain and spinal cord (central nervous system). Examples include:  Transverse myelitis.  Encephalitis.  Transient ischemic attack.  Multiple sclerosis.  Stroke.  Tumor or blood vessel problems, such as an arteriovenous malformation pressing against the brain or spinal cord.  A condition that damages the peripheral nerves (peripheral neuropathy). Peripheral nerves are not part of the brain and spinal cord. These conditions include:  Diabetes.  Peripheral vascular disease.  Nerve entrapment syndromes, such as carpal tunnel syndrome.  Shingles.  Hypothyroidism.  Vitamin B12 deficiencies.  Alcoholism.  Heavy metal poisoning (lead, arsenic).  Rheumatoid arthritis.  Systemic lupus erythematosus. DIAGNOSIS  Your caregiver will attempt to find the underlying  cause of your paresthesia. Your caregiver may:  Take your medical history.  Perform a physical exam.  Order various lab tests.  Order imaging tests. TREATMENT  Treatment for paresthesia depends on the underlying cause. HOME CARE INSTRUCTIONS  Avoid drinking alcohol.  You may consider massage or acupuncture to help relieve your symptoms.  Keep all follow-up appointments as directed by your caregiver. SEEK IMMEDIATE MEDICAL CARE IF:   You feel weak.  You have trouble walking or moving.  You have problems with speech or vision.  You feel confused.  You cannot control your bladder or bowel movements.  You feel numbness after an injury.  You faint.  Your burning or prickling feeling gets worse when walking.  You have pain, cramps, or dizziness.  You develop a rash. MAKE SURE YOU:  Understand these instructions.  Will watch your condition.  Will get help right away if you are not doing well or get worse. Document Released: 08/01/2002 Document Revised: 11/03/2011 Document Reviewed: 05/02/2011 [facility] Patient Information [address], [place]. This information is not intended to replace advice given to you by your health care provider. Make sure you discuss any questions you have with your health care provider.

## 2015-02-02 ENCOUNTER — Other Ambulatory Visit: Payer: Self-pay | Admitting: Internal Medicine

## 2015-02-02 DIAGNOSIS — H547 Unspecified visual loss: Secondary | ICD-10-CM

## 2015-02-06 ENCOUNTER — Ambulatory Visit
Admission: RE | Admit: 2015-02-06 | Discharge: 2015-02-06 | Disposition: A | Discharge status: Home / Self Care | Payer: No Typology Code available for payment source | Source: Ambulatory Visit | Attending: Internal Medicine | Admitting: Internal Medicine

## 2015-02-06 DIAGNOSIS — H547 Unspecified visual loss: Secondary | ICD-10-CM

## 2015-02-07 ENCOUNTER — Ambulatory Visit
Admission: RE | Admit: 2015-02-07 | Discharge: 2015-02-07 | Disposition: A | Discharge status: Home / Self Care | Payer: No Typology Code available for payment source | Source: Ambulatory Visit | Attending: Internal Medicine | Admitting: Internal Medicine

## 2015-02-07 DIAGNOSIS — H547 Unspecified visual loss: Secondary | ICD-10-CM

## 2015-05-16 ENCOUNTER — Other Ambulatory Visit: Payer: Self-pay | Admitting: Family Medicine

## 2015-10-22 ENCOUNTER — Ambulatory Visit: Payer: No Typology Code available for payment source | Admitting: Neurology

## 2015-10-22 ENCOUNTER — Telehealth: Payer: Self-pay | Admitting: *Deleted

## 2015-10-22 NOTE — Telephone Encounter (Signed)
Called the same day to cancel his new patient appt.

## 2015-10-23 ENCOUNTER — Encounter: Payer: Self-pay | Admitting: Neurology

## 2016-05-23 ENCOUNTER — Ambulatory Visit: Payer: No Typology Code available for payment source

## 2016-07-08 ENCOUNTER — Emergency Department (HOSPITAL_COMMUNITY): Payer: No Typology Code available for payment source

## 2016-07-08 ENCOUNTER — Encounter (HOSPITAL_COMMUNITY): Payer: Self-pay

## 2016-07-08 ENCOUNTER — Emergency Department (HOSPITAL_COMMUNITY)
Admission: EM | Admit: 2016-07-08 | Discharge: 2016-07-08 | Disposition: A | Discharge status: Home / Self Care | Payer: No Typology Code available for payment source | Attending: Emergency Medicine | Admitting: Emergency Medicine

## 2016-07-08 DIAGNOSIS — E860 Dehydration: Secondary | ICD-10-CM | POA: Insufficient documentation

## 2016-07-08 DIAGNOSIS — F17200 Nicotine dependence, unspecified, uncomplicated: Secondary | ICD-10-CM | POA: Diagnosis not present

## 2016-07-08 LAB — COMPREHENSIVE METABOLIC PANEL
ALK PHOS: 57 U/L (ref 38–126)
ALT: 18 U/L (ref 17–63)
AST: 20 U/L (ref 15–41)
Albumin: 4.3 g/dL (ref 3.5–5.0)
Anion gap: 6 (ref 5–15)
BUN: 14 mg/dL (ref 6–20)
CALCIUM: 9.8 mg/dL (ref 8.9–10.3)
CO2: 27 mmol/L (ref 22–32)
CREATININE: 1.03 mg/dL (ref 0.61–1.24)
Chloride: 106 mmol/L (ref 101–111)
Glucose, Bld: 110 mg/dL — ABNORMAL HIGH (ref 65–99)
Potassium: 4.3 mmol/L (ref 3.5–5.1)
SODIUM: 139 mmol/L (ref 135–145)
Total Bilirubin: 0.6 mg/dL (ref 0.3–1.2)
Total Protein: 6.8 g/dL (ref 6.5–8.1)

## 2016-07-08 LAB — LIPASE, BLOOD: LIPASE: 25 U/L (ref 11–51)

## 2016-07-08 LAB — CBC WITH DIFFERENTIAL/PLATELET
BASOS ABS: 0 10*3/uL (ref 0.0–0.1)
Basophils Relative: 0 %
EOS PCT: 1 %
Eosinophils Absolute: 0.1 10*3/uL (ref 0.0–0.7)
HCT: 43.7 % (ref 39.0–52.0)
HEMOGLOBIN: 15.4 g/dL (ref 13.0–17.0)
Lymphocytes Relative: 22 %
Lymphs Abs: 2.2 10*3/uL (ref 0.7–4.0)
MCH: 30.4 pg (ref 26.0–34.0)
MCHC: 35.2 g/dL (ref 30.0–36.0)
MCV: 86.4 fL (ref 78.0–100.0)
Monocytes Absolute: 0.9 10*3/uL (ref 0.1–1.0)
Monocytes Relative: 9 %
Neutro Abs: 7.1 10*3/uL (ref 1.7–7.7)
Neutrophils Relative %: 68 %
PLATELETS: 156 10*3/uL (ref 150–400)
RBC: 5.06 MIL/uL (ref 4.22–5.81)
RDW: 13 % (ref 11.5–15.5)
WBC: 10.3 10*3/uL (ref 4.0–10.5)

## 2016-07-08 LAB — MAGNESIUM: Magnesium: 1.9 mg/dL (ref 1.7–2.4)

## 2016-07-08 MED ORDER — SODIUM CHLORIDE 0.9 % IV BOLUS (SEPSIS)
1000.0000 mL | Freq: Once | INTRAVENOUS | Status: AC
  Administered 2016-07-08: 1000 mL via INTRAVENOUS

## 2016-07-08 MED ORDER — PROCHLORPERAZINE MALEATE 10 MG PO TABS: 10.0000 mg | ORAL_TABLET | Freq: Two times a day (BID) | ORAL | 0 refills | Status: DC | PRN

## 2016-07-08 MED ORDER — LORAZEPAM 2 MG/ML IJ SOLN
1.0000 mg | Freq: Once | INTRAMUSCULAR | Status: AC
  Administered 2016-07-08: 1 mg via INTRAVENOUS
  Filled 2016-07-08: qty 1

## 2016-07-08 NOTE — ED Notes (Signed)
Pt tolerated sprite well.

## 2016-07-08 NOTE — Discharge Instructions (Signed)
Please follow with your primary care doctor in the next 2 days for a check-up. They must obtain records for further management.  ° °Do not hesitate to return to the Emergency Department for any new, worsening or concerning symptoms.  ° °

## 2016-07-08 NOTE — ED Provider Notes (Signed)
[name] DEPT Provider Note   CSN: [id number] Arrival date & time: 07/08/16  0745     History   Chief Complaint Chief Complaint  Patient presents with  . Migraine   HPI  Blood pressure 140/85, pulse 79, temperature 97.7 F (36.5 C), temperature source Oral, resp. rate 16, SpO2 100 %.  Ralph Lang is a 40 y.o. male complaining of Anxiety attack and dehydration. Patient states that he had a very bad headache 4 days ago, he took Imitrex and it resolved, the associated nausea and vomiting and vomiting typical for his migraines have persisted. He states that he can't keep anything down, states that his urine is concentrated he denies abdominal pain, fever, chills, diarrhea, sick contacts he states that he feels very agitated which is typical of his anxiety attacks, he normally takes Xanax but didn't have it this morning. He says he has mild chest pain associated which is typical for his prior anxiety attacks. He denies diaphoresis, syncope, history of DVT/PE, recent immobilizations, calf pain, leg swelling, family history of early cardiac death, cocaine or methamphetamine use.   Past Medical History:  Diagnosis Date  . Anxiety   . Migraine   . Panic attack     Patient Active Problem List   Diagnosis Date Noted  . Generalized anxiety disorder 07/26/2013  . Smoker 07/26/2013    History reviewed. No pertinent surgical history.    Home Medications    Prior to Admission medications   Medication Sig Start Date End Date Taking? Authorizing Provider  alprazolam Duanne Moron) 2 MG tablet Take 2 mg by mouth 3 (three) times daily. 01/04/15   Historical Provider, MD  amitriptyline (ELAVIL) 25 MG tablet TAKE 1 TABLET (25 MG TOTAL) BY MOUTH AT BEDTIME.    Chelle Jeffery, PA-C  loperamide (IMODIUM) 2 MG capsule Take 2 capsules (4 mg total) by mouth as needed for diarrhea or loose stools. 01/31/15   Merryl Hacker, MD  ondansetron (ZOFRAN ODT) 4 MG disintegrating tablet Take 1 tablet (4  mg total) by mouth every 8 (eight) hours as needed for nausea or vomiting. 01/31/15   Merryl Hacker, MD  SUMAtriptan (IMITREX) 100 MG tablet Take 1 tab every 2 hrs for migraine. Repeat in 2 hrs if needed. PATIENT NEEDS OFFICE VISIT FOR ADDITIONAL REFILLS 05/17/15   Thao P Le, DO  topiramate (TOPAMAX) 50 MG tablet Take 1 tablet (50 mg total) by mouth daily. 04/17/14   Thao P Le, DO    Family History Family History  Problem Relation Age of Onset  . Hypertension Mother   . Hypertension Father     Social History Social History  Substance Use Topics  . Smoking status: Current Every Day Smoker  . Smokeless tobacco: Never Used  . Alcohol use No     Allergies   Patient has no known allergies.   Review of Systems Review of Systems  10 systems reviewed and found to be negative, except as noted in the HPI.   Physical Exam Updated Vital Signs BP 140/85 (BP Location: Right Arm)   Pulse 79   Temp 97.7 F (36.5 C) (Oral)   Resp 16   SpO2 100%   Physical Exam  Constitutional: He is oriented to person, place, and time. He appears well-developed and well-nourished. No distress.  HENT:  Head: Normocephalic and atraumatic.  Mouth/Throat: Oropharynx is clear and moist.  Eyes: Conjunctivae and EOM are normal. Pupils are equal, round, and reactive to light.  Neck: Normal range of  motion.  Cardiovascular: Normal rate, regular rhythm and intact distal pulses.   Pulmonary/Chest: Effort normal and breath sounds normal. No respiratory distress. He has no wheezes. He has no rales. He exhibits no tenderness.  Abdominal: Soft. He exhibits no distension and no mass. There is no tenderness. There is no rebound and no guarding. No hernia.  Musculoskeletal: Normal range of motion.  Neurological: He is alert and oriented to person, place, and time.  Follows commands, Clear, goal oriented speech, Strength is 5 out of 5x4 extremities, patient ambulates with a coordinated in nonantalgic gait. Sensation  is grossly intact.  Skin: Capillary refill takes less than 2 seconds. He is not diaphoretic.  Psychiatric:  Anxious, mildly agitated  Nursing note and vitals reviewed.    ED Treatments / Results  Labs (all labs ordered are listed, but only abnormal results are displayed) Labs Reviewed - No data to display  EKG  EKG Interpretation None       Radiology No results found.  Procedures Procedures (including critical care time)  Medications Ordered in ED Medications - No data to display   Initial Impression / Assessment and Plan / ED Course  I have reviewed the triage vital signs and the nursing notes.  Pertinent labs & imaging results that were available during my care of the patient were reviewed by me and considered in my medical decision making (see chart for details).  Clinical Course     Vitals:   07/08/16 0752 07/08/16 0800 07/08/16 0830 07/08/16 1019  BP: 140/85 124/80 123/78 122/78  Pulse: 79 68 61 80  Resp: 16   14  Temp: 97.7 F (36.5 C)     TempSrc: Oral     SpO2: 100% 99% 100% 100%    Medications  sodium chloride 0.9 % bolus 1,000 mL (0 mLs Intravenous Stopped 07/08/16 0921)  LORazepam (ATIVAN) injection 1 mg (1 mg Intravenous Given 07/08/16 0921)    Ralph Lang is 40 y.o. male presenting with Anxiety and dehydration, he had an exacerbation of his chronic migraine and has been vomiting, the migraine has resolved but he continues to vomit and feels that he is dehydrated. He has chest pain which she states is typical for his anxiety exacerbations. EKG with no acute changes, unchanged from prior, and work reassuring, patient bolused and given Ativan IV and feels significantly improved, tolerating by mouth's in the ED. Afebrile well appearing, abdominal exam is benign. Given referral to [facility] neurology and  primary care.  Evaluation does not show pathology that would require ongoing emergent intervention or inpatient treatment. Pt is  hemodynamically stable and mentating appropriately. Discussed findings and plan with patient/guardian, who agrees with care plan. All questions answered. Return precautions discussed and outpatient follow up given.      Final Clinical Impressions(s) / ED Diagnoses   Final diagnoses:  None    New Prescriptions New Prescriptions   No medications on file     Monico Blitz, PA-C 07/08/16 [place], [place] 07/09/16 [phone]

## 2016-07-08 NOTE — ED Triage Notes (Signed)
Pt reports migraine X4 days. He also reports nausea and vomiting. He reports he took imitrex at home and 2 Excedrin. Hx of migraines. Pt reports unable to tolerate anything PO. Pt alert and oriented X4.

## 2016-07-10 ENCOUNTER — Ambulatory Visit (INDEPENDENT_AMBULATORY_CARE_PROVIDER_SITE_OTHER): Payer: No Typology Code available for payment source | Admitting: Neurology

## 2016-07-10 ENCOUNTER — Encounter: Payer: Self-pay | Admitting: Neurology

## 2016-07-10 VITALS — BP 110/66 | HR 87 | Ht 73.0 in | Wt 154.0 lb

## 2016-07-10 DIAGNOSIS — F17200 Nicotine dependence, unspecified, uncomplicated: Secondary | ICD-10-CM

## 2016-07-10 DIAGNOSIS — F419 Anxiety disorder, unspecified: Secondary | ICD-10-CM | POA: Diagnosis not present

## 2016-07-10 DIAGNOSIS — G43709 Chronic migraine without aura, not intractable, without status migrainosus: Secondary | ICD-10-CM

## 2016-07-10 MED ORDER — TOPIRAMATE 50 MG PO TABS: 50.0000 mg | ORAL_TABLET | Freq: Every day | ORAL | 2 refills | Status: DC

## 2016-07-10 MED ORDER — SUMATRIPTAN SUCCINATE 6 MG/0.5ML ~~LOC~~ SOAJ: SUBCUTANEOUS | 3 refills | Status: DC

## 2016-07-10 NOTE — Patient Instructions (Signed)
Migraine Recommendations: 1.  Start topiramate 50mg  at bedtime.  Call in 4 weeks with update and we can adjust dose if needed. Possible side effects include: impaired thinking, sedation, paresthesias (numbness and tingling) and weight loss.  It may cause dehydration and there is a small risk for kidney stones, so make sure to stay hydrated with water during the day.  There is also a very small risk for glaucoma, so if you notice any change in your vision while taking this medication, see an ophthalmologist.  2.  Inject sumatriptan (Imitrex) 6mg  at earliest onset of headache.  May repeat dose once after 1 hour if needed.  Do not exceed two doses in 24 hours. 3.  Limit use of pain relievers to no more than 2 days out of the week.  These medications include acetaminophen, ibuprofen, triptans and narcotics.  This will help reduce risk of rebound headaches. 4.  Be aware of common food triggers such as processed sweets, processed foods with nitrites (such as deli meat, hot dogs, sausages), foods with MSG, alcohol (such as wine), chocolate, certain cheeses, certain fruits (dried fruits, some citrus fruit), vinegar, diet soda. 4.  Avoid caffeine 5.  Routine exercise 6.  Proper sleep hygiene 7.  Stay adequately hydrated with water 8.  Keep a headache diary. 9.  Maintain proper stress management. 10.  Do not skip meals. 11.  Consider supplements:  Magnesium citrate 400mg  to 600mg  daily, riboflavin 400mg , Coenzyme Q 10 100mg  three times daily 12.  Follow up in 3 months but contact me in 4 weeks with update.

## 2016-07-10 NOTE — Progress Notes (Signed)
NEUROLOGY CONSULTATION NOTE  RYNELL CARNER MRN: [id number] DOB: [birth year]  Referring provider: ED referral Primary care provider: no PCP  Reason for consult:  migraine  HISTORY OF PRESENT ILLNESS: Shajuan Done is a 40 year old right-handed man with migraine and anxiety who presents for headache.  History supplemented by ED note and family medicine note.  Onset:  teenager Location:  Starts upper back and neck and radiates to left side of head  Quality:  pounding Intensity:  10/10 Aura:  no Prodrome:  no Associated symptoms:  Nausea, vomiting, photophobia, phonophobia, osmophonbia Duration:  May be brief with sumatriptan or up to 4-5 hours Frequency:  At least 15 headache days per month Triggers/exacerbating factors:  Stress, change in weather, certain scents Relieving factors:  sleep Activity:  Unable to function  Past NSAIDS:  Advil Past analgesics:  Tylenol Past abortive triptans:  no Past muscle relaxants:  no Past anti-emetic:  Zofran Past antihypertensive medications:  no Past antidepressant medications:  Prozac, Wellbutrin.  Amitriptyline is listed but he does not remember taking it. Past anticonvulsant medications:  Topamax is listed but he does not remember ever taking it Other past therapies:  Botox (effective)  Current NSAIDS:  no Current analgesics:  Excedrin Current triptans:  Sumatriptan 100mg  tablet (effective but causes anxiety) Current anti-emetic:  Compazine 10mg  Current muscle relaxants:  no Current anti-anxiolytic:  Xanax Current sleep aide:  Xanax Current Antihypertensive medications:  no Current Antidepressant medications:  no Current Anticonvulsant medications:  no Current Vitamins/Herbal/Supplements:  no Current Antihistamines/Decongestants:  no Other therapy:  Cold packs, heat  Caffeine:  Mt. 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  PAST MEDICAL HISTORY: Past Medical History:  Diagnosis Date  . Anxiety   . Migraine   . Panic attack     PAST SURGICAL HISTORY: No past surgical history on file.  MEDICATIONS: Current Outpatient Prescriptions on File Prior to Visit  Medication Sig Dispense Refill  . ALPRAZolam (XANAX XR) 1 MG 24 hr tablet Take 1 mg by mouth daily.    . Aspirin-Acetaminophen-Caffeine (EXCEDRIN PO) Take 2 tablets by mouth daily as needed (headache/migraine).    Marland Kitchen loperamide (IMODIUM) 2 MG capsule Take 2 capsules (4 mg total) by mouth as needed for diarrhea or loose stools. 30 capsule 0  . prochlorperazine (COMPAZINE) 10 MG tablet Take 1 tablet (10 mg total) by mouth 2 (two) times daily as needed (Nausea or headache). 10 tablet 0  . SUMAtriptan (IMITREX) 100 MG tablet Take 1 tab every 2 hrs for migraine. Repeat in 2 hrs if needed. PATIENT NEEDS OFFICE VISIT FOR ADDITIONAL REFILLS 20 tablet 0   No current facility-administered medications on file prior to visit.     ALLERGIES: No Known Allergies  FAMILY HISTORY: Family History  Problem Relation Age of Onset  . Hypertension Mother   . Migraines Mother   . Hypertension Father   . Migraines Brother   . Migraines Maternal Grandmother     SOCIAL HISTORY: Social History   Social History  . Marital status: Married    Spouse name: N/A  . Number of children: N/A  . Years of education: N/A   Occupational History  . Not on file.   Social History Main Topics  . Smoking status: Current Every Day Smoker  . Smokeless tobacco: Never Used  .  Alcohol use No  . Drug use:     Types: Marijuana  . Sexual activity: Not on file   Other Topics Concern  . Not on file   Social History Narrative  . No narrative on file    REVIEW OF SYSTEMS: Constitutional: No fevers, chills, or sweats, no generalized fatigue, change in appetite Eyes: No visual changes, double vision, eye  pain Ear, nose and throat: No hearing loss, ear pain, nasal congestion, sore throat Cardiovascular: No chest pain, palpitations Respiratory:  No shortness of breath at rest or with exertion, wheezes GastrointestinaI: No nausea, vomiting, diarrhea, abdominal pain, fecal incontinence Genitourinary:  No dysuria, urinary retention or frequency Musculoskeletal:  No neck pain, back pain Integumentary: No rash, pruritus, skin lesions Neurological: as above Psychiatric: No depression, insomnia, anxiety Endocrine: No palpitations, fatigue, diaphoresis, mood swings, change in appetite, change in weight, increased thirst Hematologic/Lymphatic:  No purpura, petechiae. Allergic/Immunologic: no itchy/runny eyes, nasal congestion, recent allergic reactions, rashes  PHYSICAL EXAM: Vitals:   07/10/16 1508  BP: 110/66  Pulse: 87   General: No acute distress.   Head:  Normocephalic/atraumatic Eyes:  fundi examined but not visualized Neck: supple, no paraspinal tenderness, full range of motion Back: No paraspinal tenderness Heart: regular rate and rhythm Lungs: Clear to auscultation bilaterally. Vascular: No carotid bruits. Neurological Exam: Mental status: alert and oriented to person, place, and time, recent and remote memory intact, fund of knowledge intact, attention and concentration intact, speech fluent and not dysarthric, language intact. Cranial nerves: CN I: not tested CN II: pupils equal, round and reactive to light, visual fields intact CN III, IV, VI:  full range of motion, no nystagmus, no ptosis CN V: facial sensation intact CN VII: upper and lower face symmetric CN VIII: hearing intact CN IX, X: gag intact, uvula midline CN XI: sternocleidomastoid and trapezius muscles intact CN XII: tongue midline Bulk & Tone: normal, no fasciculations. Motor:  5/5 throughout  Sensation: temperature and vibration sensation intact. Deep Tendon Reflexes:  2+ throughout, toes downgoing.  Finger  to nose testing:  Without dysmetria.  Heel to shin:  Without dysmetria.  Gait:  Normal station and stride.  Able to turn and tandem walk. Romberg negative.  IMPRESSION: Chronic migraine Tobacco use  PLAN: 1.  Will start topiramate 50mg  at bedtime.  Side effects discussed 2.  Due to vomiting, will switch to sumatriptan 6mg  [place] 3.  Smoking cessation 4.  Follow up in 3 months but contact us in 4 weeks with update.  Thank you for allowing me to take part in the care of this patient.  Metta Clines, DO

## 2016-09-09 ENCOUNTER — Ambulatory Visit: Payer: No Typology Code available for payment source | Admitting: Neurology

## 2016-10-21 ENCOUNTER — Encounter: Payer: Self-pay | Admitting: Neurology

## 2016-10-21 ENCOUNTER — Ambulatory Visit (INDEPENDENT_AMBULATORY_CARE_PROVIDER_SITE_OTHER): Payer: No Typology Code available for payment source | Admitting: Neurology

## 2016-10-21 VITALS — BP 104/66 | HR 80 | Ht 73.0 in | Wt 156.0 lb

## 2016-10-21 DIAGNOSIS — G43009 Migraine without aura, not intractable, without status migrainosus: Secondary | ICD-10-CM

## 2016-10-21 DIAGNOSIS — M2550 Pain in unspecified joint: Secondary | ICD-10-CM

## 2016-10-21 DIAGNOSIS — F17200 Nicotine dependence, unspecified, uncomplicated: Secondary | ICD-10-CM | POA: Diagnosis not present

## 2016-10-21 DIAGNOSIS — F419 Anxiety disorder, unspecified: Secondary | ICD-10-CM | POA: Diagnosis not present

## 2016-10-21 MED ORDER — NORTRIPTYLINE HCL 25 MG PO CAPS: 25.0000 mg | ORAL_CAPSULE | Freq: Every day | ORAL | 3 refills | Status: DC

## 2016-10-21 NOTE — Patient Instructions (Signed)
1.  Stop topiramate.  Instead, start nortriptyline 25mg  at bedtime.  Contact me in 4 weeks with update and we can increase dose if needed 2.  Continue sumatriptan as needed, 1 tablet at earliest onset of headache, may repeat dose once in 2 hours if needed (not to exceed two tablets in 24 hours) 3.  Limit use of pain relievers to no more than 2 days out of the week to prevent rebound headache. 4.  Follow up in 3 months. 5.  For joint pain, try turmeric and establish care with PCP to discuss further.

## 2016-10-21 NOTE — Progress Notes (Signed)
NEUROLOGY FOLLOW UP OFFICE NOTE  CODYJAMES SALAMI [id number]  HISTORY OF PRESENT ILLNESS: Ralph Lang is a 41 year old right-handed man with migraine and anxiety who follows up for chronic migraine.  UPDATE: Migraines significantly improved but having side effects to Topamax.  Food doesn't taste good so he is losing weight.  He also feels off-balance. Intensity:  2-3/10 Duration:  2 to 3 hours Frequency:  2 to 3 days a month severe migraine (total 4 to 6 days of headache per month) Frequency of abortive medication: as needed. Current NSAIDS:  no Current analgesics:  Excedrin Current triptans:  sumatriptan 100mg  tablet Current anti-emetic:  Compazine 10mg  Current muscle relaxants:  no Current anti-anxiolytic:  Xanax Current sleep aide:  Xanax Current Antihypertensive medications:  no Current Antidepressant medications:  no Current Anticonvulsant medications:  topiramate 50mg  Current Vitamins/Herbal/Supplements:  no Current Antihistamines/Decongestants:  no Other therapy:  Cold packs, heat Caffeine:  Mt. Dew Alcohol:  no Smoker:  yes Diet:  Hydrates Exercise:  yes Depression/stress:  yes Sleep hygiene:  Good  He also reports diffuse joint pain.   HISTORY: Onset:  teenager Location:  Starts upper back and neck and radiates to left side of head  Quality:  pounding Initial Intensity:  10/10 Aura:  no Prodrome:  no Associated symptoms:  Nausea, vomiting, photophobia, phonophobia, osmophonbia Initial Duration:  May be brief with sumatriptan or up to 4-5 hours Initial Frequency:  At least 15 headache days per month Triggers/exacerbating factors:  Stress, change in weather, certain scents Relieving factors:  sleep Activity:  Unable to function   Past NSAIDS:  Advil Past analgesics:  Tylenol Past abortive triptans:  Sumatriptan 100mg  tablet (effective but causes anxiety) Past muscle relaxants:  no Past anti-emetic:  Zofran Past antihypertensive medications:  no Past  antidepressant medications:  Prozac, Wellbutrin.  Amitriptyline is listed but he does not remember taking it. 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  PAST MEDICAL HISTORY: Past Medical History:  Diagnosis Date  . Anxiety   . Migraine   . Panic attack     MEDICATIONS: Current Outpatient Prescriptions on File Prior to Visit  Medication Sig Dispense Refill  . alprazolam (XANAX) 2 MG tablet Take 2 mg by mouth 2 (two) times daily.     . Aspirin-Acetaminophen-Caffeine (EXCEDRIN PO) Take 2 tablets by mouth daily as needed (headache/migraine).    Marland Kitchen loperamide (IMODIUM) 2 MG capsule Take 2 capsules (4 mg total) by mouth as needed for diarrhea or loose stools. 30 capsule 0  . prochlorperazine (COMPAZINE) 10 MG tablet Take 1 tablet (10 mg total) by mouth 2 (two) times daily as needed (Nausea or headache). 10 tablet 0  . SUMAtriptan (IMITREX) 100 MG tablet Take 1 tab every 2 hrs for migraine. Repeat in 2 hrs if needed. PATIENT NEEDS OFFICE VISIT FOR ADDITIONAL REFILLS 20 tablet 0   No current facility-administered medications on file prior to visit.     ALLERGIES: No Known Allergies  FAMILY HISTORY: Family History  Problem Relation Age of Onset  . Hypertension Mother   . Migraines Mother   . Hypertension Father   . Migraines Brother   . Migraines Maternal Grandmother     SOCIAL HISTORY: Social History   Social History  . Marital status: Married    Spouse name:  N/A  . Number of children: N/A  . Years of education: N/A   Occupational History  . Not on file.   Social History Main Topics  . Smoking status: Current Every Day Smoker  . Smokeless tobacco: Never Used  . Alcohol use No  . Drug use: Yes    Types: Marijuana  . Sexual activity: Not on file   Other Topics Concern  . Not on  file   Social History Narrative  . No narrative on file    REVIEW OF SYSTEMS: Constitutional: No fevers, chills, or sweats, no generalized fatigue, change in appetite Eyes: No visual changes, double vision, eye pain Ear, nose and throat: No hearing loss, ear pain, nasal congestion, sore throat Cardiovascular: No chest pain, palpitations Respiratory:  No shortness of breath at rest or with exertion, wheezes GastrointestinaI: No nausea, vomiting, diarrhea, abdominal pain, fecal incontinence Genitourinary:  No dysuria, urinary retention or frequency Musculoskeletal:  No neck pain, back pain Integumentary: No rash, pruritus, skin lesions Neurological: as above Psychiatric: No depression, insomnia, anxiety Endocrine: No palpitations, fatigue, diaphoresis, mood swings, change in appetite, change in weight, increased thirst Hematologic/Lymphatic:  No purpura, petechiae. Allergic/Immunologic: no itchy/runny eyes, nasal congestion, recent allergic reactions, rashes  PHYSICAL EXAM: Vitals:   10/21/16 1432  BP: 104/66  Pulse: 80   General: No acute distress.  Patient appears well-groomed.  normal body habitus. Head:  Normocephalic/atraumatic Eyes:  Fundi examined but not visualized Neck: supple, no paraspinal tenderness, full range of motion Heart:  Regular rate and rhythm Lungs:  Clear to auscultation bilaterally Back: No paraspinal tenderness Neurological Exam: alert and oriented to person, place, and time. Attention span and concentration intact, recent and remote memory intact, fund of knowledge intact.  Speech fluent and not dysarthric, language intact.  CN II-XII intact. Bulk and tone normal, muscle strength 5/5 throughout.  Sensation to light touch  intact.  Deep tendon reflexes 2+ throughout.  Finger to nose testing intact.  Gait normal  IMPRESSION: Migraine Tobacco use Anxiety Diffuse joint pain  PLAN: 1.  Stop topiramate.  Instead, start nortriptyline 25mg  at bedtime.   Contact me in 4 weeks with update and we can increase dose if needed 2.  Continue sumatriptan 100mg  tablet as needed. 3.  Limit use of pain relievers to no more than 2 days out of the week to prevent rebound headache. 4.  Follow up in 3 months. 5.  For joint pain, try turmeric and establish care with PCP to discuss further.  Metta Clines, DO

## 2017-01-26 ENCOUNTER — Ambulatory Visit: Payer: No Typology Code available for payment source | Admitting: Neurology

## 2017-01-26 DIAGNOSIS — Z029 Encounter for administrative examinations, unspecified: Secondary | ICD-10-CM

## 2017-01-27 ENCOUNTER — Encounter: Payer: Self-pay | Admitting: Neurology

## 2018-03-31 ENCOUNTER — Encounter (HOSPITAL_COMMUNITY): Payer: Self-pay | Admitting: Emergency Medicine

## 2018-03-31 ENCOUNTER — Other Ambulatory Visit: Payer: Self-pay

## 2018-03-31 ENCOUNTER — Ambulatory Visit (HOSPITAL_COMMUNITY)
Admission: EM | Admit: 2018-03-31 | Discharge: 2018-03-31 | Disposition: A | Discharge status: Home / Self Care | Payer: 59 | Attending: Family Medicine | Admitting: Family Medicine

## 2018-03-31 DIAGNOSIS — R112 Nausea with vomiting, unspecified: Secondary | ICD-10-CM

## 2018-03-31 DIAGNOSIS — R1013 Epigastric pain: Secondary | ICD-10-CM

## 2018-03-31 MED ORDER — GI COCKTAIL ~~LOC~~
30.0000 mL | Freq: Once | ORAL | Status: AC
  Administered 2018-03-31: 30 mL via ORAL

## 2018-03-31 MED ORDER — GI COCKTAIL ~~LOC~~
ORAL | Status: AC
  Filled 2018-03-31: qty 30

## 2018-03-31 MED ORDER — SUCRALFATE 1 G PO TABS: 1.0000 g | ORAL_TABLET | Freq: Three times a day (TID) | ORAL | 0 refills | Status: DC

## 2018-03-31 MED ORDER — OMEPRAZOLE 20 MG PO CPDR: 20.0000 mg | DELAYED_RELEASE_CAPSULE | Freq: Two times a day (BID) | ORAL | 0 refills | Status: DC

## 2018-03-31 NOTE — ED Triage Notes (Signed)
Pt reports a 4 week history of eating and vomiting within 6 hours of eating.  He reports black, tarry stools, and a bad pain in the epigastric area and a little to the right.

## 2018-03-31 NOTE — Discharge Instructions (Signed)
Start omeprazole and carafate as directed for possible gastric/duodenal ulcer.  Bland diet, advance as tolerated.  Avoid aspirin, ibuprofen, Advil for now.  Avoid caffeine, alcohol for now.  Please make an appointment with GI doctor as soon as possible for further evaluation.  If experiencing worsening symptoms, worsening abdominal pain, blood in vomit, weakness, dizziness, go to the emergency department for further evaluation.

## 2018-03-31 NOTE — ED Provider Notes (Signed)
[facility]    CSN: [id number] Arrival date & time: 03/31/18  1141     History   Chief Complaint Chief Complaint  Patient presents with  . Abdominal Pain  . Emesis  . Melena    HPI ARDA KEADLE is a 42 y.o. male.   42 year old male comes in for 3 week history of epigastric pain, nausea, vomiting, black tarry stool. Patient states epigastric pain is dull/aching, better while eating, worse after. States he gets nauseous 4-6 hours after eating with vomiting. Denies hematemesis. States took peptobismol for the pain without improvement, had black and tarry stool that has not resolved though he stopped peptobismol. States has been burping more with decreased flatus. However, still moving bowels every day with it being "nut sized drops". He denies fever, chills, night sweats. Has history of GERD. Rare alcohol use, though when he does drink, can take 5-6 shots of liquor. Every day smoker. Frequent Excedrin use for migraines, no other NSAID use. History of appendectomy.      Past Medical History:  Diagnosis Date  . Anxiety   . Migraine   . Panic attack     Patient Active Problem List   Diagnosis Date Noted  . Generalized anxiety disorder 07/26/2013  . Smoker 07/26/2013    History reviewed. No pertinent surgical history.     Home Medications    Prior to Admission medications   Medication Sig Start Date End Date Taking? Authorizing Provider  alprazolam Duanne Moron) 2 MG tablet Take 2 mg by mouth 2 (two) times daily.    Yes [provider]  ALPRAZOLAM XR 0.5 MG 24 hr tablet Take 0.5 mg by mouth 2 (two) times daily. 10/07/16  Yes [provider]  SUMAtriptan (IMITREX) 100 MG tablet Take 1 tab every 2 hrs for migraine. Repeat in 2 hrs if needed. PATIENT NEEDS OFFICE VISIT FOR ADDITIONAL REFILLS 05/17/15  Yes Le, Thao P, DO  Aspirin-Acetaminophen-Caffeine (EXCEDRIN PO) Take 2 tablets by mouth daily as needed (headache/migraine).    [provider]    omeprazole (PRILOSEC) 20 MG capsule Take 1 capsule (20 mg total) by mouth 2 (two) times daily before a meal for 14 days. 03/31/18 04/14/18  Tasia Catchings, Merci Walthers V, PA-C  sucralfate (CARAFATE) 1 g tablet Take 1 tablet (1 g total) by mouth 4 (four) times daily -  with meals and at bedtime for 14 days. 03/31/18 04/14/18  Ok Edwards, PA-C    Family History Family History  Problem Relation Age of Onset  . Hypertension Mother   . Migraines Mother   . Hypertension Father   . Migraines Brother   . Migraines Maternal Grandmother     Social History Social History   Tobacco Use  . Smoking status: Current Every Day Smoker  . Smokeless tobacco: Never Used  Substance Use Topics  . Alcohol use: No  . Drug use: Yes    Types: Marijuana     Allergies   Patient has no known allergies.   Review of Systems Review of Systems  Reason unable to perform ROS: See HPI as above.     Physical Exam Triage Vital Signs ED Triage Vitals  Enc Vitals Group     BP 03/31/18 1151 116/83     Pulse --      Resp --      Temp 03/31/18 1151 98.1 F (36.7 C)     Temp Source 03/31/18 1151 Oral     SpO2 03/31/18 1151 100 %  Weight --      Height --      Head Circumference --      Peak Flow --      Pain Score 03/31/18 1153 3     Pain Loc --      Pain Edu? --      Excl. in [place]? --    No data found.  Updated Vital Signs BP 116/83 (BP Location: Left Arm)   Pulse 81   Temp 98.1 F (36.7 C) (Oral)   Resp 18   SpO2 100%   Physical Exam  Constitutional: He is oriented to person, place, and time. He appears well-developed and well-nourished.  Non-toxic appearance. He does not appear ill. No distress.  HENT:  Head: Normocephalic and atraumatic.  Cardiovascular: Normal rate, regular rhythm and normal heart sounds. Exam reveals no gallop and no friction rub.  No murmur heard. Pulmonary/Chest: Effort normal and breath sounds normal. He has no wheezes. He has no rales.  Abdominal: Soft. Bowel sounds are normal. He  exhibits no mass. There is no rigidity, no rebound, no guarding and no CVA tenderness.  Epigastric pain on palpation.   Genitourinary:  Genitourinary Comments: DRE deferred.  Neurological: He is alert and oriented to person, place, and time.  Skin: Skin is warm and dry. He is not diaphoretic. No pallor.  Psychiatric: He has a normal mood and affect. His behavior is normal. Judgment normal.   UC Treatments / Results  Labs (all labs ordered are listed, but only abnormal results are displayed) Labs Reviewed - No data to display  EKG None  Radiology No results found.  Procedures Procedures (including critical care time)  Medications Ordered in UC Medications  gi cocktail (Maalox,Lidocaine,Donnatal) (30 mLs Oral Given 03/31/18 1249)    Initial Impression / Assessment and Plan / UC Course  I have reviewed the triage vital signs and the nursing notes.  Pertinent labs & imaging results that were available during my care of the patient were reviewed by me and considered in my medical decision making (see chart for details).    History and exam most consistent with peptic ulcer. Discussed worries for possible upper GI bleed. Patient is afebrile without tachycardia, tachypnea. He is able to ambulate without difficulty, without pale mucosal membrane, low suspicion for anemia requiring transfusion. Given patient is stable, discussed with patient, though with concerning history and exam, does not require immediate workup at the emergency department. GI cocktail given in office. Will have patient start carafate, omeprazole as directed. Bland diet. Follow up with GI as soon as possible for further workup. Strict return precautions given. Patient expresses understanding and agrees to plan.  Case discussed with Dr Mannie Stabile, who agrees to plan.   Final Clinical Impressions(s) / UC Diagnoses   Final diagnoses:  Epigastric pain  Intractable vomiting with nausea, unspecified vomiting type    ED  Prescriptions    Medication Sig Dispense Auth. Provider   sucralfate (CARAFATE) 1 g tablet Take 1 tablet (1 g total) by mouth 4 (four) times daily -  with meals and at bedtime for 14 days. 56 tablet Amelia Burgard V, PA-C   omeprazole (PRILOSEC) 20 MG capsule Take 1 capsule (20 mg total) by mouth 2 (two) times daily before a meal for 14 days. 28 capsule Tobin Chad, [place] 03/31/18 1413

## 2018-06-02 ENCOUNTER — Encounter: Payer: Self-pay | Admitting: Gastroenterology

## 2018-06-10 ENCOUNTER — Ambulatory Visit (INDEPENDENT_AMBULATORY_CARE_PROVIDER_SITE_OTHER): Payer: 59 | Admitting: Gastroenterology

## 2018-06-10 ENCOUNTER — Other Ambulatory Visit (INDEPENDENT_AMBULATORY_CARE_PROVIDER_SITE_OTHER): Payer: 59

## 2018-06-10 ENCOUNTER — Encounter: Payer: Self-pay | Admitting: Gastroenterology

## 2018-06-10 VITALS — BP 118/70 | HR 92 | Ht 71.25 in | Wt 154.0 lb

## 2018-06-10 DIAGNOSIS — R109 Unspecified abdominal pain: Secondary | ICD-10-CM | POA: Diagnosis not present

## 2018-06-10 DIAGNOSIS — R6339 Other feeding difficulties: Secondary | ICD-10-CM

## 2018-06-10 DIAGNOSIS — R633 Feeding difficulties: Secondary | ICD-10-CM | POA: Diagnosis not present

## 2018-06-10 DIAGNOSIS — R1013 Epigastric pain: Secondary | ICD-10-CM

## 2018-06-10 DIAGNOSIS — R6881 Early satiety: Secondary | ICD-10-CM | POA: Diagnosis not present

## 2018-06-10 DIAGNOSIS — R634 Abnormal weight loss: Secondary | ICD-10-CM | POA: Diagnosis not present

## 2018-06-10 LAB — BASIC METABOLIC PANEL
BUN: 17 mg/dL (ref 6–23)
CHLORIDE: 104 meq/L (ref 96–112)
CO2: 30 mEq/L (ref 19–32)
Calcium: 10.4 mg/dL (ref 8.4–10.5)
Creatinine, Ser: 1 mg/dL (ref 0.40–1.50)
GFR: 87.85 mL/min (ref >60.00)
Glucose, Bld: 91 mg/dL (ref 70–99)
POTASSIUM: 4 meq/L (ref 3.5–5.1)
Sodium: 142 mEq/L (ref 135–145)

## 2018-06-10 LAB — CBC
HEMATOCRIT: 48.2 % (ref 39.0–52.0)
Hemoglobin: 16 g/dL (ref 13.0–17.0)
MCHC: 33.3 g/dL (ref 30.0–36.0)
MCV: 89 fl (ref 78.0–100.0)
Platelets: 159 10*3/uL (ref 150.0–400.0)
RBC: 5.42 Mil/uL (ref 4.22–5.81)
RDW: 13.3 % (ref 11.5–15.5)
WBC: 13.5 10*3/uL — AB (ref 4.0–10.5)

## 2018-06-10 LAB — HEPATIC FUNCTION PANEL
ALK PHOS: 75 U/L (ref 39–117)
ALT: 15 U/L (ref 0–53)
AST: 13 U/L (ref 0–37)
Albumin: 4.9 g/dL (ref 3.5–5.2)
BILIRUBIN TOTAL: 0.6 mg/dL (ref 0.2–1.2)
Bilirubin, Direct: 0.1 mg/dL (ref 0.0–0.3)
Total Protein: 8.1 g/dL (ref 6.0–8.3)

## 2018-06-10 LAB — HIGH SENSITIVITY CRP: CRP HIGH SENSITIVITY: 1.37 mg/L (ref 0.000–5.000)

## 2018-06-10 LAB — TSH: TSH: 0.96 u[IU]/mL (ref 0.35–4.50)

## 2018-06-10 LAB — IGA: IgA: 240 mg/dL (ref 68–378)

## 2018-06-10 LAB — LIPASE: Lipase: 13 U/L (ref 11.0–59.0)

## 2018-06-10 MED ORDER — OMEPRAZOLE 40 MG PO CPDR: 40.0000 mg | DELAYED_RELEASE_CAPSULE | Freq: Two times a day (BID) | ORAL | 3 refills | Status: DC

## 2018-06-10 NOTE — Progress Notes (Addendum)
[facility] VISIT   Primary Care Provider Patient, No Pcp Per No address on file None  Referring Provider No referring provider defined for this encounter.   Patient Profile: ARSHAWN VALDEZ is a 42 y.o. male with a pmh significant for Migraines, Anxiety.  The patient presents to the [facility] for an evaluation and management of problem(s) noted below:  Problem List 1. Midepigastric pain   2. Unintentional weight loss   3. Early satiety   4. Food aversion     History of Present Illness: This is the patient's first visit to the [facility].  He has significant abdominal pains that have been ongoing for the last few months and he is self-referred today.  He recounts that in the summer around July he had a finger infection that required antibiotics and lancing it after taking antibiotics for about 2 days (not clear to the type of antibiotics that he took but we will try to get records) he developed a "torn up stomach."  As a result of feeling significant abdominal pain as well as nausea and vomiting he stopped his antibiotics.  However the pain recurred pretty significantly and has not left over the course the last 3 months.  He has a mid epigastric as well as right upper quadrant abdominal pain that comes and goes during the course of the day.  However he is never had a full day without abdominal pains.  He gets associated nausea with vomiting that does occur.  Interestingly, the patient feels that as soon as his stomach is empty he gets to have more significant cramping and discomfort.  As soon as he eats he will have relief of his pain.  Milk helps relieve his discomfort.  When he was vomiting earlier on a daily basis around the onset of symptoms this has slowed down but he does continue to have nausea.  He is noted a change in his bowel habits recently and he has become constipated as well.  He is only using the restroom every few days now  when he was previously regular on a daily basis.  He feels that his weight has decreased about 5 to 7 pounds during the last 3 months.  He has been taking omeprazole 20 mg daily for a total of around 6 weeks (1 month of 20 mg of the prescription and then 2 weeks of an over-the-counter medication regimen).  He does think that this is helped slightly as he has stopped it recently and now has had recurrence of worsening pain.  He smokes less than a pack per day currently.  He does take nonsteroidals in the setting of migraine medications with the use of Excedrin however he is not using it on a daily basis.  He does not take any other BC/Goody powders.  He has never had an upper or lower endoscopy.  GI Review of Systems Positive as above infrequent pyrosis which is usually a result of dietary indiscretions, early satiety, food avoidance due to concern of causing discomfort (although he describes improvement when he eats) Negative for dysphasia, odynophagia, jaundice, melena, hematochezia  Review of Systems General: Denies fevers/chills/night sweats HEENT: Denies oral lesions Cardiovascular: Denies chest pain Pulmonary: Denies shortness of breath Gastroenterological: See HPI Genitourinary: Denies darkened urine Hematological: Denies easy bruising/bleeding Endocrine: Denies temperature intolerance Dermatological: Denies new skin changes Psychological: Mood is anxious and concerned about trying to get to the bottom of this Musculoskeletal: Denies new arthralgias   Medications  Current Outpatient Medications  Medication Sig Dispense Refill  . alprazolam (XANAX) 2 MG tablet Take 2 mg by mouth 2 (two) times daily.     Marland Kitchen ALPRAZOLAM XR 0.5 MG 24 hr tablet Take 0.5 mg by mouth 2 (two) times daily.    . Aspirin-Acetaminophen-Caffeine (EXCEDRIN PO) Take 3 tablets by mouth daily as needed (headache/migraine).     . SUMAtriptan (IMITREX) 100 MG tablet Take 1 tab every 2 hrs for migraine. Repeat in 2 hrs if  needed. PATIENT NEEDS OFFICE VISIT FOR ADDITIONAL REFILLS (Patient taking differently: Take 1/2 tab every 2 hrs for migraine. Repeat in 2 hrs if needed.) 20 tablet 0  . omeprazole (PRILOSEC) 40 MG capsule Take 1 capsule (40 mg total) by mouth 2 (two) times daily. 60 capsule 3   No current facility-administered medications for this visit.     Allergies No Known Allergies  Histories Past Medical History:  Diagnosis Date  . Anxiety   . Migraine   . Panic attack    Past Surgical History:  Procedure Laterality Date  . APPENDECTOMY    . CYST REMOVAL HAND    . CYST REMOVAL NECK    . LACERATION REPAIR Right    wrist   Social History   Socioeconomic History  . Marital status: Married    Spouse name: Not on file  . Number of children: 1  . Years of education: Not on file  . Highest education level: Not on file  Occupational History  . Occupation: Engineer, building services  Social Needs  . Financial resource strain: Not on file  . Food insecurity:    Worry: Not on file    Inability: Not on file  . Transportation needs:    Medical: Not on file    Non-medical: Not on file  Tobacco Use  . Smoking status: Current Every Day Smoker  . Smokeless tobacco: Never Used  Substance and Sexual Activity  . Alcohol use: Yes    Comment: once a month  . Drug use: Yes    Types: Marijuana  . Sexual activity: Not on file  Lifestyle  . Physical activity:    Days per week: Not on file    Minutes per session: Not on file  . Stress: Not on file  Relationships  . Social connections:    Talks on phone: Not on file    Gets together: Not on file    Attends religious service: Not on file    Active member of club or organization: Not on file    Attends meetings of clubs or organizations: Not on file    Relationship status: Not on file  . Intimate partner violence:    Fear of current or ex partner: Not on file    Emotionally abused: Not on file    Physically abused: Not on file    Forced sexual  activity: Not on file  Other Topics Concern  . Not on file  Social History Narrative  . Not on file   Family History  Problem Relation Age of Onset  . Hypertension Mother   . Migraines Mother   . Hypertension Father   . Migraines Brother   . Migraines Maternal Grandmother   . Alzheimer's disease Maternal Grandmother   . Colon cancer Neg Hx   . Esophageal cancer Neg Hx   . Inflammatory bowel disease Neg Hx   . Liver disease Neg Hx   . Pancreatic cancer Neg Hx   . Rectal cancer Neg Hx   .  Stomach cancer Neg Hx    I have reviewed his medical, social, and family history in detail and updated the electronic medical record as necessary.    PHYSICAL EXAMINATION  BP 118/70 (BP Location: Left Arm, Patient Position: Sitting, Cuff Size: Normal)   Pulse 92   Ht 5' 11.25" (1.81 m) Comment: height measured without shoes  Wt 154 lb (69.9 kg)   BMI 21.33 kg/m  Wt Readings from Last 3 Encounters:  06/10/18 154 lb (69.9 kg)  10/21/16 156 lb (70.8 kg)  07/10/16 154 lb (69.9 kg)  GEN: NAD, appears stated age, doesn't appear chronically ill PSYCH: Cooperative, without pressured speech EYE: Conjunctivae pink, sclerae anicteric ENT: MMM, without oral ulcers, no erythema or exudates noted NECK: Supple CV: RR without R/Gs  RESP: CTAB posteriorly, without wheezing GI: NABS, soft, tenderness to palpation in the midepigastrium and right upper quadrant upon deep palpation, ND, without rebound or guarding, no HSM appreciated MSK/EXT: No lower extremity edema SKIN: No jaundice NEURO:  Alert & Oriented x 3, no focal deficits   REVIEW OF DATA  I reviewed the following data at the time of this encounter:  GI Procedures and Studies  No relevant studies  Laboratory Studies  Reviewed in epic  Imaging Studies  No relevant studies   ASSESSMENT  Mr. Sullenberger is a 42 y.o. male with a pmh significant for Migraines, Anxiety.  The patient is seen today for evaluation and management of:  1.  Midepigastric pain   2. Unintentional weight loss   3. Early satiety   4. Food aversion    This is a hemodynamically stable patient who has significant abdominal pain with described unintentional weight loss.  The etiology of his abdominal pain is a clinical history significant for likely peptic ulcer disease.  However the long-standing nature of the course of last 3 months while being on a course of acid suppression medication for about 6 weeks without significant clinical improvement does concern me.  Food aversion as well as mild early satiety also concerns me.  He is a smoker and thus is at risk of ulcer disease as well as malignancies of the upper GI tract.  I think it is reasonable to proceed with an diagnostic upper endoscopy to evaluate for ulcer disease and rule out H. pylori which we cannot rule out currently because he has been on recent PPI therapy.  We will send off labs to rule out other etiologies of abdominal discomfort including pancreas issues as well as liver issues.  If we are unable to find an etiology for his discomfort via an upper endoscopy then we will plan on obtaining either cross-sectional imaging and/or abdominal ultrasound imaging.  I would like him to increase his PPI to 40 mg twice daily and we will send a prescription for that.  The risks and benefits of endoscopic evaluation were discussed with the patient; these include but are not limited to the risk of perforation, infection, bleeding, missed lesions, lack of diagnosis, severe illness requiring hospitalization, as well as anesthesia and sedation related illnesses.  The patient is agreeable to proceed.  All patient questions were answered, to the best of my ability, and the patient agrees to the aforementioned plan of action with follow-up as indicated.   PLAN  1. Midepigastric pain - CBC; Future - Basic metabolic panel; Future - Lipase; Future - Hepatic function panel; Future - TSH; Future - High sensitivity CRP;  Future - Tissue transglutaminase, IgA; Future - IgA; Future - Calcium,  ionized; Future - Diagnostic EGD - Omeprazole 40 mg BID - NSAID avoidance  2. Unintentional weight loss  3. Early satiety  4. Food aversion   Orders Placed This Encounter  Procedures  . CBC  . Basic metabolic panel  . Lipase  . Hepatic function panel  . TSH  . High sensitivity CRP  . Tissue transglutaminase, IgA  . IgA  . Calcium, ionized    New Prescriptions   OMEPRAZOLE (PRILOSEC) 40 MG CAPSULE    Take 1 capsule (40 mg total) by mouth 2 (two) times daily.   Modified Medications   No medications on file    Planned Follow Up: No follow-ups on file.   Justice Britain, MD [facility] Office # [phone]

## 2018-06-10 NOTE — Patient Instructions (Signed)
Your provider has requested that you go to the basement level for lab work before leaving today. Press "B" on the elevator. The lab is located at the first door on the left as you exit the elevator.  We will contact you in the next few weeks to schedule appointment for Colonoscopy and EGD  Please start fiber supplement 1-2 times daily  Miralax 1 capful daily for 2 weeks Then start 2 capfuls daily  Ducolax or Senna  Daily  We have sent the following medications to your pharmacy for you to pick up at your convenience: Omeprazole 40 mg twice daily

## 2018-06-11 LAB — CALCIUM, IONIZED: CALCIUM ION: 5.53 mg/dL (ref 4.8–5.6)

## 2018-06-11 LAB — TISSUE TRANSGLUTAMINASE, IGA: (tTG) Ab, IgA: 1 U/mL

## 2018-06-13 ENCOUNTER — Encounter: Payer: Self-pay | Admitting: Gastroenterology

## 2018-06-14 ENCOUNTER — Telehealth: Payer: Self-pay | Admitting: Gastroenterology

## 2018-06-14 DIAGNOSIS — R1013 Epigastric pain: Secondary | ICD-10-CM | POA: Insufficient documentation

## 2018-06-14 DIAGNOSIS — R634 Abnormal weight loss: Secondary | ICD-10-CM | POA: Insufficient documentation

## 2018-06-14 DIAGNOSIS — R6339 Other feeding difficulties: Secondary | ICD-10-CM | POA: Insufficient documentation

## 2018-06-14 DIAGNOSIS — R6881 Early satiety: Secondary | ICD-10-CM | POA: Insufficient documentation

## 2018-06-14 DIAGNOSIS — R633 Feeding difficulties: Secondary | ICD-10-CM | POA: Insufficient documentation

## 2018-06-14 NOTE — Telephone Encounter (Signed)
Spoke to patient to inform him of his test results,he voiced understanding and stated he is no longer under the care of Dr Alyson Ingles. He has been scheduled an appointment with Dr Ethlyn Gallery PCP on 07/02/18. Patient has also been scheduled an endo/colon 06/30/18,with pre visit 06/18/18.

## 2018-06-18 ENCOUNTER — Encounter: Payer: Self-pay | Admitting: Gastroenterology

## 2018-06-18 ENCOUNTER — Ambulatory Visit (AMBULATORY_SURGERY_CENTER): Payer: Self-pay

## 2018-06-18 VITALS — Ht 72.0 in | Wt 155.2 lb

## 2018-06-18 DIAGNOSIS — R1013 Epigastric pain: Secondary | ICD-10-CM

## 2018-06-18 NOTE — Progress Notes (Signed)
Denies allergies to eggs or soy products. Denies complication of anesthesia or sedation. Denies use of weight loss medication. Denies use of O2.   Emmi instructions declined.  

## 2018-06-30 ENCOUNTER — Encounter: Payer: Self-pay | Admitting: Gastroenterology

## 2018-06-30 ENCOUNTER — Ambulatory Visit (AMBULATORY_SURGERY_CENTER): Payer: 59 | Admitting: Gastroenterology

## 2018-06-30 VITALS — BP 112/72 | HR 77 | Temp 97.5°F | Resp 11 | Ht 71.0 in | Wt 154.0 lb

## 2018-06-30 DIAGNOSIS — K2970 Gastritis, unspecified, without bleeding: Secondary | ICD-10-CM | POA: Diagnosis not present

## 2018-06-30 DIAGNOSIS — D128 Benign neoplasm of rectum: Secondary | ICD-10-CM | POA: Diagnosis not present

## 2018-06-30 DIAGNOSIS — R634 Abnormal weight loss: Secondary | ICD-10-CM

## 2018-06-30 DIAGNOSIS — K648 Other hemorrhoids: Secondary | ICD-10-CM | POA: Diagnosis not present

## 2018-06-30 DIAGNOSIS — K5730 Diverticulosis of large intestine without perforation or abscess without bleeding: Secondary | ICD-10-CM

## 2018-06-30 DIAGNOSIS — D127 Benign neoplasm of rectosigmoid junction: Secondary | ICD-10-CM | POA: Diagnosis present

## 2018-06-30 DIAGNOSIS — D124 Benign neoplasm of descending colon: Secondary | ICD-10-CM

## 2018-06-30 DIAGNOSIS — R109 Unspecified abdominal pain: Secondary | ICD-10-CM

## 2018-06-30 DIAGNOSIS — K3189 Other diseases of stomach and duodenum: Secondary | ICD-10-CM

## 2018-06-30 DIAGNOSIS — K259 Gastric ulcer, unspecified as acute or chronic, without hemorrhage or perforation: Secondary | ICD-10-CM | POA: Diagnosis not present

## 2018-06-30 HISTORY — PX: COLONOSCOPY WITH ESOPHAGOGASTRODUODENOSCOPY (EGD): SHX5779

## 2018-06-30 MED ORDER — SODIUM CHLORIDE 0.9 % IV SOLN: 500.0000 mL | Freq: Once | INTRAVENOUS | Status: DC

## 2018-06-30 NOTE — Progress Notes (Signed)
PT taken to PACU. Monitors in place. VSS. Report given to RN. 

## 2018-06-30 NOTE — Op Note (Signed)
[facility] Patient Name: Ralph Lang Procedure Date: 06/30/2018 7:40 AM MRN: [id number] Endoscopist: Justice Britain , MD Age: 42 Referring MD:  Date of Birth: 1975-11-11 Gender: Male Account #: [id number] Procedure:                Upper GI endoscopy Indications:              Generalized abdominal pain, Dysphagia, Heartburn,                            Early satiety, Weight loss Medicines:                Monitored Anesthesia Care Procedure:                Pre-Anesthesia Assessment:                           - Prior to the procedure, a History and Physical                            was performed, and patient medications and                            allergies were reviewed. The patient's tolerance of                            previous anesthesia was also reviewed. The risks                            and benefits of the procedure and the sedation                            options and risks were discussed with the patient.                            All questions were answered, and informed consent                            was obtained. Prior Anticoagulants: The patient has                            taken no previous anticoagulant or antiplatelet                            agents. ASA Grade Assessment: II - A patient with                            mild systemic disease. After reviewing the risks                            and benefits, the patient was deemed in                            satisfactory condition to undergo the procedure.  After obtaining informed consent, the endoscope was                            passed under direct vision. Throughout the                            procedure, the patient's blood pressure, pulse, and                            oxygen saturations were monitored continuously. The                            Endoscope was introduced through the mouth, and                            advanced to the second part  of duodenum. The upper                            GI endoscopy was accomplished without difficulty.                            The patient tolerated the procedure. Scope In: Scope Out: Findings:                 No gross lesions were noted in the entire                            esophagus. Biopsies were taken with a cold forceps                            for histology from the proximal/middle/distal                            esophagus.                           The Z-line was irregular and was found 40 cm from                            the incisors.                           Multiple dispersed, small-medium (including long                            segments) non-bleeding erosions were found in the                            gastric antrum. There were no stigmata of recent                            bleeding.                           No other gross lesions were noted in the entire  examined stomach. Biopsies were taken with a cold                            forceps for histology and Helicobacter pylori                            testing from the antrum/incisura/greater                            curve/lesser curve.                           Diffuse moderately erythematous mucosa was found in                            the duodenal bulb.                           No other gross lesions were noted in the second                            portion of the duodenum. Biopsies were taken with a                            cold forceps for histology from the bulb and D2. Complications:            No immediate complications. Estimated Blood Loss:     Estimated blood loss was minimal. Impression:               - No gross lesions in esophagus. Biopsied for EoE.                            Z-line irregular, 40 cm from the incisors.                           - Non-bleeding erosive gastropathy. Otherwise no                            gross lesions in the stomach. Biopsied  for HP.                           - Erythematous duodenopathy in bulb. Otherwise, no                            gross lesions in the second portion of the                            duodenum. Biopsied for Enteropathy/Celiac. Recommendation:           - Proceed to schedule colonoscopy.                           - Await pathology results. If positive for HP will  need therapy.                           - Continue Omeprazole 40 mg twice daily for next                            9-months. Will plan to decrease to 40 mg daily for                            101-months. Then decrease to 20 mg daily and follow                            up as indicated.                           - The findings and recommendations were discussed                            with the patient.                           - The findings and recommendations were discussed                            with the patient's family. Justice Britain, MD 06/30/2018 8:38:55 AM

## 2018-06-30 NOTE — Progress Notes (Signed)
Pt's states no medical or surgical changes since previsit or office visit. 

## 2018-06-30 NOTE — Patient Instructions (Addendum)
Thank you for allowing [place] to care for you today!  Await pathology results approximately 2 weeks.  Further recommendations will be made once results are final.  Resume previous diet and medications today.  Continue Omeprazole 40 mg twice daily for the next 2 months.   Decrease to 40 mg daily in two months (August 30 2018), then decrease to 20 mg daily October 29, 2018.  Start Fibercon 1-2 times daily. (over-the counter)   Start Colace (over-the-counter stool softener)   1-2 times daily.   Preparation H Suppositories or Anu-sol suppositories to decrease inflammation of the hemorrhoids.   Sitz baths 1-2 times daily as needed.    Follow up with Dr Rush Landmark  in 3-4 weeks.  Return to normal activities tomorrow.        YOU HAD AN ENDOSCOPIC PROCEDURE TODAY AT [facility]:   Refer to the procedure report that was given to you for any specific questions about what was found during the examination.  If the procedure report does not answer your questions, please call your gastroenterologist to clarify.  If you requested that your care partner not be given the details of your procedure findings, then the procedure report has been included in a sealed envelope for you to review at your convenience later.  YOU SHOULD EXPECT: Some feelings of bloating in the abdomen. Passage of more gas than usual.  Walking can help get rid of the air that was put into your GI tract during the procedure and reduce the bloating. If you had a lower endoscopy (such as a colonoscopy or flexible sigmoidoscopy) you may notice spotting of blood in your stool or on the toilet paper. If you underwent a bowel prep for your procedure, you may not have a normal bowel movement for a few days.  Please Note:  You might notice some irritation and congestion in your nose or some drainage.  This is from the oxygen used during your procedure.  There is no need for concern and it should clear up in a day or so.  SYMPTOMS  TO REPORT IMMEDIATELY:   Following lower endoscopy (colonoscopy or flexible sigmoidoscopy):  Excessive amounts of blood in the stool  Significant tenderness or worsening of abdominal pains  Swelling of the abdomen that is new, acute  Fever of 100F or higher   Following upper endoscopy (EGD)  Vomiting of blood or coffee ground material  New chest pain or pain under the shoulder blades  Painful or persistently difficult swallowing  New shortness of breath  Fever of 100F or higher  Black, tarry-looking stools  For urgent or emergent issues, a gastroenterologist can be reached at any hour by calling [phone].   DIET:  We do recommend a small meal at first, but then you may proceed to your regular diet.  Drink plenty of fluids but you should avoid alcoholic beverages for 24 hours.  ACTIVITY:  You should plan to take it easy for the rest of today and you should NOT DRIVE or use heavy machinery until tomorrow (because of the sedation medicines used during the test).    FOLLOW UP: Our staff will call the number listed on your records the next business day following your procedure to check on you and address any questions or concerns that you may have regarding the information given to you following your procedure. If we do not reach you, we will leave a message.  However, if you are feeling well and you are not  experiencing any problems, there is no need to return our call.  We will assume that you have returned to your regular daily activities without incident.  If any biopsies were taken you will be contacted by phone or by letter within the next 1-3 weeks.  Please call us at [phone] if you have not heard about the biopsies in 3 weeks.    SIGNATURES/CONFIDENTIALITY: You and/or your care partner have signed paperwork which will be entered into your electronic medical record.  These signatures attest to the fact that that the information above on your After Visit Summary has  been reviewed and is understood.  Full responsibility of the confidentiality of this discharge information lies with you and/or your care-partner.

## 2018-06-30 NOTE — Progress Notes (Signed)
Called to room to assist during endoscopic procedure.  Patient ID and intended procedure confirmed with present staff. Received instructions for my participation in the procedure from the performing physician.  

## 2018-06-30 NOTE — Op Note (Addendum)
[place] Patient Name: Ralph Lang Procedure Date: 06/30/2018 7:38 AM MRN: [id number] Endoscopist: Justice Britain , MD Age: 42 Referring MD:  Date of Birth: Jun 29, 1976 Gender: Male Account #: [id number] Procedure:                Colonoscopy Indications:              Generalized abdominal pain, Change in bowel habits,                            Change in stool caliber, Weight loss Medicines:                Monitored Anesthesia Care Procedure:                Pre-Anesthesia Assessment:                           - Prior to the procedure, a History and Physical                            was performed, and patient medications and                            allergies were reviewed. The patient's tolerance of                            previous anesthesia was also reviewed. The risks                            and benefits of the procedure and the sedation                            options and risks were discussed with the patient.                            All questions were answered, and informed consent                            was obtained. Prior Anticoagulants: The patient has                            taken no previous anticoagulant or antiplatelet                            agents. ASA Grade Assessment: II - A patient with                            mild systemic disease. After reviewing the risks                            and benefits, the patient was deemed in                            satisfactory condition to undergo the procedure.  After obtaining informed consent, the colonoscope                            was passed under direct vision. Throughout the                            procedure, the patient's blood pressure, pulse, and                            oxygen saturations were monitored continuously. The                            Colonoscope was introduced through the anus and                            advanced to the 10 cm  into the ileum. The                            colonoscopy was performed without difficulty. The                            patient tolerated the procedure. The quality of the                            bowel preparation was evaluated using the BBPS                            [facility] Bowel Preparation Scale) with scores of:                            Right Colon = 3, Transverse Colon = 3 and Left                            Colon = 3 (entire mucosa seen well with no residual                            staining, small fragments of stool or opaque                            liquid). The total BBPS score equals 9. Scope In: 7:59:03 AM Scope Out: 8:27:36 AM Scope Withdrawal Time: 0 hours 25 minutes 31 seconds  Total Procedure Duration: 0 hours 28 minutes 33 seconds  Findings:                 The digital rectal exam findings include                            non-thrombosed external hemorrhoids, non-thrombosed                            internal hemorrhoids and internal hemorrhoids that                            prolapse with  straining, but require manual                            replacement into the anal canal (Grade III).                            Pertinent negatives include no palpable rectal                            lesions.                           The terminal ileum and ileocecal valve appeared                            normal. Biopsies were taken with a cold forceps for                            histology to rule out IBD.                           Two sessile polyps were found in the descending                            colon. The polyps were 2 to 4 mm in size. These                            polyps were removed with a cold snare. Resection                            and retrieval were complete.                           Two sessile polyps were found in the rectum. The                            polyps were 1 to 2 mm in size. These polyps were                             removed with a jumbo cold forceps. Resection and                            retrieval were complete.                           A 10 mm polyp was found in the recto-sigmoid colon.                            The polyp was semi-sessile. Preparations were made                            for mucosal resection. Saline was injected to raise  the lesion. Snare mucosal resection was performed.                            Resection and retrieval were complete. Coagulation                            for tissue destruction using snare tip to the edge                            was successful.                           A few small-mouthed diverticula were found in the                            sigmoid colon and descending colon.                           Normal mucosa was found in the entire colon                            otherwise. Biopsies were taken with a cold forceps                            for histology to rule out IBD.                           Very inflammed, partially bleeding non-thrombosed                            external and internal hemorrhoids were found during                            retroflexion, during perianal exam and during                            digital exam. The hemorrhoids were large and Grade                            III (internal hemorrhoids that prolapse but require                            manual reduction). Complications:            No immediate complications. Estimated Blood Loss:     Estimated blood loss was minimal. Impression:               - Non-thrombosed external hemorrhoids,                            non-thrombosed internal hemorrhoids and internal                            hemorrhoids that prolapse with straining, but  require manual replacement into the anal canal                            (Grade III) found on digital rectal exam.                           - The examined portion of the ileum  was normal.                            Biopsied.                           - Two 2 to 4 mm polyps in the descending colon,                            removed with a cold snare. Resected and retrieved.                           - Two 1 to 2 mm polyps in the rectum, removed with                            a jumbo cold forceps. Resected and retrieved.                           - One 10 mm polyp at the recto-sigmoid colon,                            removed with mucosal resection. Resected and                            retrieved. Treated with a hot snare tip to decrease                            recurrence.                           - Diverticulosis in the sigmoid colon and in the                            descending colon.                           - Normal mucosa in the entire examined colon                            otherwise. Biopsied.                           - Inflammed, partially bleeding non-thrombosed                            external and internal hemorrhoids. Recommendation:           - The patient will be observed post-procedure,  until all discharge criteria are met.                           - Discharge patient to home.                           - Patient has a contact number available for                            emergencies. The signs and symptoms of potential                            delayed complications were discussed with the                            patient. Return to normal activities tomorrow.                            Written discharge instructions were provided to the                            patient.                           - Start Fibercon 1-2 times daily.                           - Start Colace 1-2 times daily.                           - Will need to consider based on bowel movements                           - Preparation H Suppositories or Anu-sol                            suppositories to decrease inflammation in  region.                           - Sitz baths 1-2 times daily.                           - Await pathology results.                           - Depending on how patient is doing with PPI                            therapy for his PUD noted on EGD, if he is still                            losing weight, will need to obtain a                            CT-Abdomen/Pelvis with contrast.                           -  Repeat colonoscopy in 10/27/08 years for                            surveillance based on pathology results.                           - The findings and recommendations were discussed                            with the patient.                           - The findings and recommendations were discussed                            with the patient's family. Justice Britain, MD 06/30/2018 8:49:59 AM

## 2018-07-01 ENCOUNTER — Telehealth: Payer: Self-pay

## 2018-07-01 NOTE — Telephone Encounter (Signed)
  Follow up Call-  Call back number 06/30/2018  Post procedure Call Back phone  # [phone]  Permission to leave phone message Yes  Some recent data might be hidden     Patient questions:  Do you have a fever, pain , or abdominal swelling? Yes.   Pain Score  3 *  Have you tolerated food without any problems? Yes.    Have you been able to return to your normal activities? Yes.    Do you have any questions about your discharge instructions: Diet   No. Medications  No. Follow up visit  No.  Do you have questions or concerns about your Care? No.  Actions: * If pain score is 4 or above: No action needed, pain <4.  Patient complains of rectal soreness/pressure and feels he continues having the urge to have a BM.  Minimal bleeding from hemorrhoids.  Patient doesn't feel comfortable returning to work today but stated he has a follow up scheduled with his new PCP tomorrow and feels they will provide a work note if he needs one. He was encouraged to contact us if he has any worsening symptoms or further questions.

## 2018-07-02 ENCOUNTER — Ambulatory Visit (INDEPENDENT_AMBULATORY_CARE_PROVIDER_SITE_OTHER): Payer: 59 | Admitting: Family Medicine

## 2018-07-02 ENCOUNTER — Encounter: Payer: Self-pay | Admitting: Family Medicine

## 2018-07-02 VITALS — BP 110/62 | HR 92 | Temp 97.8°F | Ht 72.0 in | Wt 151.2 lb

## 2018-07-02 DIAGNOSIS — K649 Unspecified hemorrhoids: Secondary | ICD-10-CM

## 2018-07-02 DIAGNOSIS — G43909 Migraine, unspecified, not intractable, without status migrainosus: Secondary | ICD-10-CM | POA: Insufficient documentation

## 2018-07-02 DIAGNOSIS — D72829 Elevated white blood cell count, unspecified: Secondary | ICD-10-CM

## 2018-07-02 DIAGNOSIS — R05 Cough: Secondary | ICD-10-CM | POA: Diagnosis not present

## 2018-07-02 DIAGNOSIS — L821 Other seborrheic keratosis: Secondary | ICD-10-CM

## 2018-07-02 DIAGNOSIS — A630 Anogenital (venereal) warts: Secondary | ICD-10-CM | POA: Insufficient documentation

## 2018-07-02 DIAGNOSIS — R059 Cough, unspecified: Secondary | ICD-10-CM

## 2018-07-02 LAB — CBC WITH DIFFERENTIAL/PLATELET
BASOS PCT: 0.2 % (ref 0.0–3.0)
Basophils Absolute: 0 10*3/uL (ref 0.0–0.1)
EOS PCT: 0.8 % (ref 0.0–5.0)
Eosinophils Absolute: 0.1 10*3/uL (ref 0.0–0.7)
HCT: 45 % (ref 39.0–52.0)
Hemoglobin: 15.4 g/dL (ref 13.0–17.0)
LYMPHS ABS: 3.3 10*3/uL (ref 0.7–4.0)
Lymphocytes Relative: 21.1 % (ref 12.0–46.0)
MCHC: 34.2 g/dL (ref 30.0–36.0)
MCV: 87.9 fl (ref 78.0–100.0)
MONOS PCT: 6.7 % (ref 3.0–12.0)
Monocytes Absolute: 1 10*3/uL (ref 0.1–1.0)
NEUTROS ABS: 11.1 10*3/uL — AB (ref 1.4–7.7)
NEUTROS PCT: 71.2 % (ref 43.0–77.0)
PLATELETS: 172 10*3/uL (ref 150.0–400.0)
RBC: 5.11 Mil/uL (ref 4.22–5.81)
RDW: 13.4 % (ref 11.5–15.5)
WBC: 15.6 10*3/uL — ABNORMAL HIGH (ref 4.0–10.5)

## 2018-07-02 MED ORDER — HYDROCORTISONE 2.5 % RE CREA: 1.0000 "application " | TOPICAL_CREAM | Freq: Two times a day (BID) | RECTAL | 1 refills | Status: DC

## 2018-07-02 MED ORDER — PROPRANOLOL HCL 20 MG PO TABS: 20.0000 mg | ORAL_TABLET | Freq: Two times a day (BID) | ORAL | 2 refills | Status: DC

## 2018-07-02 NOTE — Progress Notes (Signed)
Ralph Lang DOB: 05/21/1976 Encounter date: 07/02/2018  This isa 42 y.o. male who presents to establish care. Chief Complaint  Patient presents with  . New Patient (Initial Visit)    migraines, states imitrex makes him fell "weird", used botox once but was unable to get it again,  had polyps cut out wednesday and is still bleeding, very painful    History of present illness:  Had upper GI on 11/6 found to have erosive gastropathy (non-bleeding); biopsies were taken. Taper of omeprazole planned. Colonoscopy also completed: 2 polyps sent off to path. Still having bleeding from biopsies. Bleeding has decreased at this point. Has aggravated hemorrhoids.   Follows with Dr. Toy Care for psychiatry. Xanax it prescribed through him.   Has HPV and has wart that is excessive - right to side of groin on left. Has been there for a year - not getting bigger. Not had any treatment for it.   Has had migraines since teenager. Thinks stomach issues may come from that - taking excedrin, medication to try and help with this. Has tried different medications that have worked but always taken off meds at some point and headaches come back. Imitrex expensive for him but also makes him feel jittery, woozy, anxious. Go to is excedrin, but doesn't always work when they are severe. Still getting 2-3/week. botox worked well for him. Thinks it was propranolol that worked for him. Migraines are worse in fall and colder months. Pain starts in back of neck (if takes excedrin right away it goes away); creeps up head to forehead. Cold room, cold rag on head; tries to get to sleep. Sometimes difficult to get to sleep. Imitrex relieves in 45 min-1 hour. No diarrhea. No visual disturbance with headache. No vomiting. Throbbing. Shoulders get tight, sore. Massage helps. Last botox was 6-7 years ago and it kept him headache free for 1.5 year.   States that white count has been elevated through past. Has also lost 9 lbs without trying  which concerned him and prompted GI evaluation.   Smoking 3/4-1 PPD. Has quit in past and knows he needs to do it again.   Had steel under fingernail that he ended up needing to get surgically removed. Started antibiotics. Started with vomiting and since that time has been getting abdominal pains. Started with prilosec after seeing [facility]. No longer vomiting. No diarrhea.   No chills, sweats, fevers at home. Has no energy to do things anymore. Worsening over past 5-10 years.    Past Medical History:  Diagnosis Date  . Anxiety   . GERD (gastroesophageal reflux disease)   . Kidney stone   . Migraine   . Panic attack    Past Surgical History:  Procedure Laterality Date  . APPENDECTOMY    . COLONOSCOPY WITH ESOPHAGOGASTRODUODENOSCOPY (EGD)  06/30/2018  . CYST REMOVAL HAND    . CYST REMOVAL NECK    . LACERATION REPAIR Right    wrist   No Known Allergies Current Meds  Medication Sig  . alprazolam (XANAX) 2 MG tablet Take 2 mg by mouth 2 (two) times daily.   Marland Kitchen ALPRAZOLAM XR 0.5 MG 24 hr tablet Take 0.5 mg by mouth 2 (two) times daily.  . Aspirin-Acetaminophen-Caffeine (EXCEDRIN PO) Take 3 tablets by mouth daily as needed (headache/migraine).   Marland Kitchen omeprazole (PRILOSEC) 40 MG capsule Take 1 capsule (40 mg total) by mouth 2 (two) times daily.  . SUMAtriptan (IMITREX) 100 MG tablet Take 1 tab every 2 hrs for migraine.  Repeat in 2 hrs if needed. PATIENT NEEDS OFFICE VISIT FOR ADDITIONAL REFILLS (Patient taking differently: Take 1/2 tab every 2 hrs for migraine. Repeat in 2 hrs if needed.)   Social History   Tobacco Use  . Smoking status: Current Every Day Smoker    Types: Cigarettes  . Smokeless tobacco: Never Used  Substance Use Topics  . Alcohol use: Yes    Comment: once a month   Family History  Problem Relation Age of Onset  . Hypertension Mother   . Migraines Mother   . Hypertension Father   . Migraines Brother   . Migraines Maternal Grandmother   . Alzheimer's disease  Maternal Grandmother   . Colon cancer Paternal Uncle   . Esophageal cancer Neg Hx   . Inflammatory bowel disease Neg Hx   . Liver disease Neg Hx   . Pancreatic cancer Neg Hx   . Rectal cancer Neg Hx   . Stomach cancer Neg Hx      Review of Systems  Constitutional: Negative for chills, fatigue and fever.  HENT: Negative for congestion.   Respiratory: Negative for cough, chest tightness, shortness of breath and wheezing.   Cardiovascular: Negative for chest pain, palpitations and leg swelling.  Gastrointestinal: Positive for anal bleeding, blood in stool and diarrhea (since prep; improving). Negative for abdominal pain, constipation, nausea and vomiting.  Neurological: Positive for headaches.  Psychiatric/Behavioral: The patient is nervous/anxious (working on decreasing total med intake and would like to get off of all medications).     Objective:  BP 110/62 (BP Location: Left Arm, Patient Position: Sitting, Cuff Size: Normal)   Pulse 92   Temp 97.8 F (36.6 C) (Oral)   Ht 6' (1.829 m)   Wt 151 lb 3.2 oz (68.6 kg)   SpO2 98%   BMI 20.51 kg/m   Weight: 151 lb 3.2 oz (68.6 kg)   BP Readings from Last 3 Encounters:  07/02/18 110/62  06/30/18 112/72  06/10/18 118/70   Wt Readings from Last 3 Encounters:  07/02/18 151 lb 3.2 oz (68.6 kg)  06/30/18 154 lb (69.9 kg)  06/18/18 155 lb 3.2 oz (70.4 kg)    Physical Exam  Constitutional: He is oriented to person, place, and time. He appears well-developed and well-nourished. No distress.  Eyes: Pupils are equal, round, and reactive to light. Conjunctivae are normal.  Cardiovascular: Normal rate, regular rhythm and normal heart sounds. Exam reveals no friction rub.  No murmur heard. No lower extremity edema  Pulmonary/Chest: Effort normal and breath sounds normal. No respiratory distress. He has no wheezes. He has no rales.  Abdominal: Soft. Normal appearance and bowel sounds are normal. There is no tenderness.  Neurological: He  is alert and oriented to person, place, and time.  Skin:  There is approx 0.5cm brown papule with irregular surface suprapubic area with 0.25 brown papule adjacent. Lesions appear consistent with seborrheic keratosis.  Psychiatric: His behavior is normal. Cognition and memory are normal.    Assessment/Plan:  1. Migraine without status migrainosus, not intractable, unspecified migraine type Has been seen by headache clinic in past. Is willing to pay for botox if he has to for relief from headaches. Prefers this to taking daily medication. Has had success with propranolol in past so for now will refill this and work on tapering up as tolerated and to get better control of headaches.   (of note on record review did try topiramate and amitriptyline in past)  - AMB referral to headache clinic  2. Leukocytosis, unspecified type Recheck and will need heme-onc referral if still elevated. - CBC with Differential/Platelet; Future - DG Chest 2 View; Future - CBC with Differential/Platelet - Pathologist smear review  3. Cough Weight loss, cough, smoking history. - DG Chest 2 View; Future  4. Hemorrhoids, unspecified hemorrhoid type anusol-hc sent in; follow up with GI if not resolving/worsening in next couple of weeks.  5. Seborrheic keratosis Return for shaving of lesions. He was under impression that these were HPV related but they do not have that appearance. Will send for path with shaving.    Return schedule mole removal at convenience.  Micheline Rough, MD

## 2018-07-05 ENCOUNTER — Emergency Department (HOSPITAL_COMMUNITY)
Admission: EM | Admit: 2018-07-05 | Discharge: 2018-07-05 | Disposition: A | Discharge status: Home / Self Care | Payer: 59 | Attending: Emergency Medicine | Admitting: Emergency Medicine

## 2018-07-05 ENCOUNTER — Encounter (HOSPITAL_COMMUNITY): Payer: Self-pay | Admitting: Emergency Medicine

## 2018-07-05 ENCOUNTER — Telehealth: Payer: Self-pay | Admitting: Gastroenterology

## 2018-07-05 ENCOUNTER — Other Ambulatory Visit: Payer: Self-pay | Admitting: Family Medicine

## 2018-07-05 ENCOUNTER — Telehealth: Payer: Self-pay

## 2018-07-05 DIAGNOSIS — K644 Residual hemorrhoidal skin tags: Secondary | ICD-10-CM | POA: Insufficient documentation

## 2018-07-05 DIAGNOSIS — K6289 Other specified diseases of anus and rectum: Secondary | ICD-10-CM

## 2018-07-05 DIAGNOSIS — F17210 Nicotine dependence, cigarettes, uncomplicated: Secondary | ICD-10-CM | POA: Diagnosis not present

## 2018-07-05 DIAGNOSIS — Z79899 Other long term (current) drug therapy: Secondary | ICD-10-CM | POA: Diagnosis not present

## 2018-07-05 DIAGNOSIS — D72829 Elevated white blood cell count, unspecified: Secondary | ICD-10-CM

## 2018-07-05 DIAGNOSIS — F1210 Cannabis abuse, uncomplicated: Secondary | ICD-10-CM | POA: Diagnosis not present

## 2018-07-05 LAB — PATHOLOGIST SMEAR REVIEW

## 2018-07-05 MED ORDER — OXYCODONE-ACETAMINOPHEN 5-325 MG PO TABS: 2.0000 | ORAL_TABLET | ORAL | 0 refills | Status: DC | PRN

## 2018-07-05 MED ORDER — NITROGLYCERIN 0.4 % RE OINT: 1.0000 "application " | TOPICAL_OINTMENT | Freq: Two times a day (BID) | RECTAL | 0 refills | Status: DC

## 2018-07-05 NOTE — Telephone Encounter (Signed)
-----   Message from Alfredia Ferguson, PA-C sent at 07/04/2018 12:44 PM EST ----- Regarding: pt with prob thrombosed hemorrhoid Pt called Sunday with severe rectal pain since Colon/EGD last week - thinks has 3 hemorrhoids , one is rock hard past couple days and extremely painful - he reduced it yesterday - has been doing tub soaks , prep H, and Analpram, I believe .  Nothing helping. Declined to go to ER today due to high co-pay though told him an ER MD could  [place] and remove clot which would alleviate his pain .  He wants to be seen in the office Monday am - please call him early am - if still excruciatingly painful  Would see if can get him into [place] surgery  As urgent tomorrow - APP could see in office but thrombosed we will just be sending to surgeon  I added Lidocaine 5% / Recticare - to use several times /day until sxs improve

## 2018-07-05 NOTE — ED Provider Notes (Signed)
[place] DEPT Provider Note   CSN: [id number] Arrival date & time: 07/05/18  9798     History   Chief Complaint Chief Complaint  Patient presents with  . Rectal Pain    HPI Ralph Lang is a 42 y.o. male.  He had a upper and lower endoscopy on Wednesday for some epigastric discomfort and weight loss.  Since the procedure he has had progressively worsening rectal pain.  He states he has had some bleeding before but with the prep he had a lot of bleeding.  He is never had pain this severe and has been unable to work.  Is been trying multiple stool softeners and topical medications without any relief.  He denies any fever no abdominal pain no vomiting.  The history is provided by the patient.  Rectal Bleeding  Quality:  Bright red Amount:  Scant Timing:  Intermittent Chronicity:  New Context: hemorrhoids and rectal pain   Context: not constipation   Pain details:    Quality:  Sharp and stabbing   Severity:  Severe   Timing:  Constant   Progression:  Worsening Similar prior episodes: no   Relieved by:  Nothing Worsened by:  Defecation and wiping Ineffective treatments:  Hemorrhoid cream and sitz baths Associated symptoms: no abdominal pain, no fever, no hematemesis, no light-headedness and no vomiting     Past Medical History:  Diagnosis Date  . Anxiety   . GERD (gastroesophageal reflux disease)   . Kidney stone   . Migraine   . Panic attack     Patient Active Problem List   Diagnosis Date Noted  . Genital warts 07/02/2018  . Migraine 07/02/2018  . Midepigastric pain 06/14/2018  . Unintentional weight loss 06/14/2018  . Early satiety 06/14/2018  . Food aversion 06/14/2018  . Generalized anxiety disorder 07/26/2013  . Smoker 07/26/2013    Past Surgical History:  Procedure Laterality Date  . APPENDECTOMY    . COLONOSCOPY WITH ESOPHAGOGASTRODUODENOSCOPY (EGD)  06/30/2018  . CYST REMOVAL HAND    . CYST REMOVAL NECK    .  LACERATION REPAIR Right    wrist        Home Medications    Prior to Admission medications   Medication Sig Start Date End Date Taking? Authorizing Provider  alprazolam Duanne Moron) 2 MG tablet Take 2 mg by mouth 2 (two) times daily.     [provider]  ALPRAZOLAM XR 0.5 MG 24 hr tablet Take 0.5 mg by mouth 2 (two) times daily. 10/07/16   [provider]  Aspirin-Acetaminophen-Caffeine (EXCEDRIN PO) Take 3 tablets by mouth daily as needed (headache/migraine).     [provider]  hydrocortisone (ANUSOL-HC) 2.5 % rectal cream Place 1 application rectally 2 (two) times daily. 07/02/18   Caren Macadam, MD  omeprazole (PRILOSEC) 40 MG capsule Take 1 capsule (40 mg total) by mouth 2 (two) times daily. 06/10/18   Mansouraty, Telford Nab., MD  propranolol (INDERAL) 20 MG tablet Take 1 tablet (20 mg total) by mouth 2 (two) times daily. 07/02/18   Caren Macadam, MD  SUMAtriptan (IMITREX) 100 MG tablet Take 1 tab every 2 hrs for migraine. Repeat in 2 hrs if needed. PATIENT NEEDS OFFICE VISIT FOR ADDITIONAL REFILLS Patient taking differently: Take 1/2 tab every 2 hrs for migraine. Repeat in 2 hrs if needed. 05/17/15   Glenford Bayley, DO    Family History Family History  Problem Relation Age of Onset  . Hypertension Mother   .  Migraines Mother   . Hypertension Father   . Migraines Brother   . Migraines Maternal Grandmother   . Alzheimer's disease Maternal Grandmother   . Colon cancer Paternal Uncle   . Esophageal cancer Neg Hx   . Inflammatory bowel disease Neg Hx   . Liver disease Neg Hx   . Pancreatic cancer Neg Hx   . Rectal cancer Neg Hx   . Stomach cancer Neg Hx     Social History Social History   Tobacco Use  . Smoking status: Current Every Day Smoker    Types: Cigarettes  . Smokeless tobacco: Never Used  Substance Use Topics  . Alcohol use: Yes    Comment: once a month  . Drug use: Yes    Types: Marijuana    Comment: last had 3-4 weeks ago      Allergies   Patient has no known allergies.   Review of Systems Review of Systems  Constitutional: Negative for fever.  HENT: Negative for sore throat.   Eyes: Negative for visual disturbance.  Respiratory: Negative for shortness of breath.   Cardiovascular: Negative for chest pain.  Gastrointestinal: Positive for blood in stool, hematochezia and rectal pain. Negative for abdominal pain, hematemesis and vomiting.  Genitourinary: Negative for dysuria.  Musculoskeletal: Negative for back pain.  Skin: Negative for rash.  Neurological: Negative for light-headedness.     Physical Exam Updated Vital Signs BP (!) 151/76 (BP Location: Right Arm)   Pulse 84   Temp 98.4 F (36.9 C) (Oral)   Resp 19   SpO2 97%   Physical Exam  Constitutional: He appears well-developed and well-nourished.  HENT:  Head: Normocephalic and atraumatic.  Eyes: Conjunctivae are normal.  Neck: Neck supple.  Cardiovascular: Normal rate and regular rhythm.  No murmur heard. Pulmonary/Chest: Effort normal and breath sounds normal. No respiratory distress.  Abdominal: Soft. There is no tenderness.  Genitourinary: Rectal exam shows external hemorrhoid and tenderness.  Genitourinary Comments: He has multiple tender external hemorrhoids that are not purplish in color.  He is unable to tolerate an internal exam.  There does not appear to be any perianal mass or induration.  Musculoskeletal: He exhibits no edema.  Neurological: He is alert.  Skin: Skin is warm and dry.  Psychiatric: He has a normal mood and affect.  Nursing note and vitals reviewed.    ED Treatments / Results  Labs (all labs ordered are listed, but only abnormal results are displayed) Labs Reviewed - No data to display  EKG None  Radiology No results found.  Procedures Procedures (including critical care time)  Medications Ordered in ED Medications - No data to display   Initial Impression / Assessment and Plan / ED  Course  I have reviewed the triage vital signs and the nursing notes.  Pertinent labs & imaging results that were available during my care of the patient were reviewed by me and considered in my medical decision making (see chart for details).  Clinical Course as of Jul 06 804  Mon Jul 05, 2018  1041 Patient's GI doctor Dr. Vinnie Level called me regarding the patient's procedure.  He recommends getting surgery to take a look at him to see if he may need a procedure.   [MB]  1112 he was seen by general surgery PA.  She reviewed her findings with Dr. Marcello Moores surgical attending and they have no plan for surgical intervention.  The only recommendation I have is to maybe add nitroglycerin topical.  They will follow-up  in the office as an outpatient.   [MB]  1112 Reviewed the plan with the patient and he seems very dissatisfied.  He is quite upset about having missed some much work and he is not living with his faxed.  None of the hemorrhoids are thrombosed and would benefit from a local I&D.   [MB]    Clinical Course User Index [MB] Hayden Rasmussen, MD     Final Clinical Impressions(s) / ED Diagnoses   Final diagnoses:  External hemorrhoids  Rectal pain    ED Discharge Orders         Ordered    Nitroglycerin 0.4 % OINT  2 times daily     07/05/18 1118    oxyCODONE-acetaminophen (PERCOCET/ROXICET) 5-325 MG tablet  Every 4 hours PRN     07/05/18 1118           Hayden Rasmussen, MD 07/06/18 [phone]

## 2018-07-05 NOTE — ED Triage Notes (Signed)
Per pt, states he saw GI MD last Wednesday-states he doesn't know what MD did but now he thinks he has a hemorrhoid-states it is hard as a rock-unable to sit comfortably

## 2018-07-05 NOTE — Telephone Encounter (Signed)
I was in between my scheduled outpatient procedures this AM when I saw this message from [employer]. I subsequently proceeded with calling the patient and reached him. He is currently in the [facility] ED getting into his gown. He is frustrated and angry over the phone about whether the colonoscopy he had last week has caused him to have issues develop. He feels that I have ruined his life with his procedures. He states that he has been in pain since the procedure. He states he does not want to talk with me but wants me to talk with the ED providers and subsequently hung up the phone. I see that the patient had been called in follow up after his procedure by our staff with discomfort but he felt that his new PCP would be able to help with next steps.  I was not alerted to this call. I subsequently have read the PCP note and see that he was prescribed Anusol with plan to follow up thereafter with GI if indicated. Before yesterday's Inbox message that I received, I had not heard that the patient was doing unwell after his procedure. Thus the reasoning, this AM to have my staff call and proceed with an urgent visit to [place] or to [place] if necessary but the issues of a potentially thrombosed hemorrhoid are not something that we can handle in the office. He had been told to proceed to the ED yesterday after he had called in, but he reported the copayment as being too high. He had significantly inflammed but not thrombosed hemorrhoids on his Colonoscopy evaluation from last week. I have now called the [facility] ED and am awaiting to speak with the charge RN about the patient as I am writing this message.   Justice Britain, MD [facility] Advanced [facility] Office # [phone]

## 2018-07-05 NOTE — Discharge Instructions (Addendum)
You were evaluated in the emergency department for severe anal pain.  You had multiple hemorrhoids but they were not thrombosed.  General surgery saw you and they also felt that there was no indication for procedure.  He should continue the topical lidocaine and the steroids and we are also prescribing some nitroglycerin cream to use to the area.  We are also prescribing some pain medicine.  Please follow-up with general surgery Dr. Marcello Moores and also your GI doctor or 1 of his partners.

## 2018-07-05 NOTE — Consult Note (Signed)
[facility] Surgery Consult Note  Ralph Lang 25-Feb-1976  [id number].    Requesting MD: Melina Copa MD Chief Complaint/Reason for Consult: rectal pain HPI:  Patient is a 42 year old male who presented to [facility] with rectal pain since endoscopy/colonoscopy 11/6. Underwent these tests for generalized abdominal pain, early satiety, weight loss and stool changes. Found to have some ulcerative disease on UGI and internal/external hemorrhoids on colonoscopy. Patient reports severe rectal pain that has worsened since then. Has been doing sitz baths, steroid cream, preparation H cream and taking colace/miralax at home. He is frustrated with pain and having to miss work from this. Patient reports he initially had rectal bleeding prior to colonoscopy but this is actually better. Patient denied history of problems with hemorrhoids prior to this.   ROS: Review of Systems  Constitutional: Negative for chills and fever.  Respiratory: Negative for shortness of breath.   Cardiovascular: Negative for chest pain.  Gastrointestinal: Negative for abdominal pain, constipation, nausea and vomiting.       Rectal pain   Genitourinary: Negative for dysuria, frequency and urgency.  All other systems reviewed and are negative.   Family History  Problem Relation Age of Onset  . Hypertension Mother   . Migraines Mother   . Hypertension Father   . Migraines Brother   . Migraines Maternal Grandmother   . Alzheimer's disease Maternal Grandmother   . Colon cancer Paternal Uncle   . Esophageal cancer Neg Hx   . Inflammatory bowel disease Neg Hx   . Liver disease Neg Hx   . Pancreatic cancer Neg Hx   . Rectal cancer Neg Hx   . Stomach cancer Neg Hx     Past Medical History:  Diagnosis Date  . Anxiety   . GERD (gastroesophageal reflux disease)   . Kidney stone   . Migraine   . Panic attack     Past Surgical History:  Procedure Laterality Date  . APPENDECTOMY    . COLONOSCOPY WITH  ESOPHAGOGASTRODUODENOSCOPY (EGD)  06/30/2018  . CYST REMOVAL HAND    . CYST REMOVAL NECK    . LACERATION REPAIR Right    wrist    Social History:  reports that he has been smoking cigarettes. He has never used smokeless tobacco. He reports that he drinks alcohol. He reports that he has current or past drug history. Drug: Marijuana.  Allergies: No Known Allergies   (Not in a hospital admission)  Blood pressure (!) 151/76, pulse 84, temperature 98.4 F (36.9 C), temperature source Oral, resp. rate 19, SpO2 97 %. Physical Exam: Physical Exam  Constitutional: He is oriented to person, place, and time. He appears well-developed and well-nourished.  HENT:  Head: Normocephalic and atraumatic.  Right Ear: External ear normal.  Left Ear: External ear normal.  Nose: Nose normal.  Eyes: Pupils are equal, round, and reactive to light. Conjunctivae and EOM are normal. No scleral icterus.  Neck: Normal range of motion. Neck supple.  Cardiovascular: Normal rate and regular rhythm.  Pulmonary/Chest: Effort normal.  Abdominal: Soft.  Genitourinary: Rectal exam shows external hemorrhoid (not thrombosed) and tenderness.  Musculoskeletal: Normal range of motion.  Neurological: He is alert and oriented to person, place, and time.  Skin: Skin is warm and dry. No rash noted. He is not diaphoretic.  Psychiatric: He has a normal mood and affect. His behavior is normal.    No results found for this or any previous visit (from the past 48 hour(s)). No results found.  Assessment/Plan Internal and external hemorrhoids - not thrombosed - no indication for drainage - recommend continuation of sitz baths, colace/miralax, steroid cream - could consider NG cream or diltiazem cream - follow up with GI prn - can arrange outpatient follow up to discuss hemorrhoidectomy if desired  Discharge from ED. No indications for acute surgical intervention.   Brigid Re, [facility]  Surgery 07/05/2018, 11:17 AM Pager: [phone] Consults: [phone] Mon-Fri 7:00 am-4:30 pm Sat-Sun 7:00 am-11:30 am

## 2018-07-05 NOTE — Telephone Encounter (Signed)
Mansouraty, Telford Nab., MD  Alfredia Ferguson, PA-C Cc: Timothy Lasso, RN        Amy,  Thanks for sending this out.  I'm sorry to hear that.  Seems like he should have gone to the ED for lancing.   Dalores Weger, if he is still having significant pain, then he needs to be seen by the surgeons ASAP to get the lesion lanced.  If he can't then he needs to be seen today as an urgent but then will need the urgent visit to the surgeons, because if it is thrombosed there is only a certain time period before they can no longer lance the hemorrhoid.   Thanks.

## 2018-07-05 NOTE — Telephone Encounter (Signed)
I called the patient this afternoon. I also reviewed the chart from the ED. I had received a copy of the ED follow up referral from the surgery consultation. At time of this notation, the ED note has not been finalized as of yet. It was felt that the patient did not have thrombosed external or internal hemorrhoids and that conservative measures were to be indicated moving forward including consideration of NTG or Diltiazem cream/ointment. No plan for EUA or lancing was indicated. The patient was currently performing a Sitz bath after his discharge from the ED. We spoke on the phone for over 25 minutes, allowing the patient time to describe his concerns and frustration. He felt that I had abandoned him. He felt that I caused his hemorrhoids to become so engorged and inflamed that I should not have had him do a colonoscopy preparation or actually do the colonoscopy itself. He had been prescribed Oxycodone but he states that he doesn't want to take that because it can cause issues for patients and cause more pain. He was not aware of the medication until he got it from the pharmacy. He also picked up the NTG cream and that the cost was over $850. Although rare, hemorrhoids can get inflamed after a colonoscopy preparation.   At the time of my finding of his hemorrhoids being inflamed, I spoke with he and his mother about the results and gave indications and therapies to try and employ as documented in my Colonoscopy note. I let the patient know that I was sorry that he had developed these symptoms from the colonoscopy preparation but overall, the colonoscopy itself was not likely to be the cause of his current symptoms. I asked him to continue his Stool softeners, as well as his fiber.  I asked him to increase to twice daily fiber.  He feels that he is having adequate bowel movements but every time the stool passes that he has pain as the hemorrhoid becomes more inflamed. He needs to continue Sitz  bathes a few times per day as he is able. He went over the discharge instructions and asked if I could perform sclerotherapy to his hemorrhoids or banding ligation. I told him these were not indicated procedures in the setting of his current inflamed status and that I could not provide either of these, nor would my clinic as it is not performed in this particular fashion. No fissure was noted on the examination by the ED or surgical service, but hopefully the NTG will help. I asked him to try and put it up to the first knuckle of his finger into the rectum.  He said that the directions did not tell him that and he felt that we were giving incorrect information. At the end of our conversation, he said that he had not gotten anywhere further, and now had to go to get his child ready for bed. I had stated that we could trial him on Ultram instead of Oxycodone but  He said that he wanted to not take pain medication but just wanted to get rid of the hemorrhoids. I told him it would take time and everyone heals differently from inflamed hemorrhoids. He stated that he had some follow up to be made with surgery in the next few weeks. He ended the conversation and hung up the phone. I think it would be ideal to try and have the patient seen in follow up with surgery to discuss timing of potential intervention to his hemorrhoids as  it was felt he did not have an urgent need for procedure. I will relay this to our surgical colleagues to try and get a follow up if it hasn't already been done.  Justice Britain, MD [facility] Advanced [facility]oscopy Office # [phone]

## 2018-07-05 NOTE — Telephone Encounter (Signed)
See alternate phone note  

## 2018-07-05 NOTE — Telephone Encounter (Signed)
The pt walked into the office this morning wanting to be seen for severe pain in the rectum.  Has internal and external hemorrhoids.  He was unable to stand upright and was in visible pain.  He was advised to go to the ED for evaluation.  He states he is not paying $1000 to walk into the ED.  I advised him that there was nothing we could do to alleviate the pain here in the office and the ED could offer treatment that could ease the pain.  He states Dr Rush Landmark ruined his life and no one told him that "this" would happen after the colonoscopy.  He took out his phone and began to record our conversation.  He asked which hospital he should go to and I said it doesn't matter either would be fine and he then said "oh now the truth comes out; I don't matter.  I clarified my statement that either hospital was fine that the choice of hospital does not matter, not him.  He said this is not the end of this.  He was very rude and confrontational.

## 2018-07-05 NOTE — Telephone Encounter (Signed)
I just got off the phone with Dr. Melina Copa who is his ED MD. He describes having just seen the patient. He noted a similar appearance of "cauliflower like erythema and inflammation" of the hemorrhoids. He is not sure if there is just once that needs to be lanced, but did not describe a purplish tinge or color. I think, the next step in evaluation will likely be consideration of a surgical evaluation for concern of thrombosed hemmorhoids and to see if there needs an Examination Under Anesthesia or potential lancing. If it is felt that he has not thrombosed, then he may benefit from Diltiazem or Nitroglycerin ointment moving forward (thought that is only available at compounding pharmacy) for relief in discomfort and will need significant Sitz bathes. I will relay this information to Dr. Lyndel Safe and PA Fabio Asa who are at the [facility] this week so they are aware of possible consultation.  Justice Britain, MD [facility] Advanced [facility] Office # [phone]

## 2018-07-06 ENCOUNTER — Encounter: Payer: Self-pay | Admitting: Gastroenterology

## 2018-07-06 NOTE — Telephone Encounter (Signed)
I spoke to patient yesterday in regards the lab results. The patient stated he is still not feeling well and was waiting on GI to call. He has agreed to hematology referral.  Would you like for me to call again?

## 2018-07-06 NOTE — Telephone Encounter (Signed)
Could you check in with him on Tuesday and see how he is doing? Looks like he had rough time with hemorrhoids and ended up in ER. Has some specialty visits pending. Just make sure there is nothing we can do to help on our end. Looks like he is set up with gen surg and GI has reached out to him.   Thanks!

## 2018-07-07 ENCOUNTER — Ambulatory Visit: Payer: 59 | Admitting: Family Medicine

## 2018-07-07 ENCOUNTER — Telehealth: Payer: Self-pay | Admitting: *Deleted

## 2018-07-07 NOTE — Telephone Encounter (Signed)
Good morning Dr. Ethlyn Gallery, I have cced you on an Inbox message to see that we were working on follow up with surgery being arranged at this time for the next couple of weeks to re-evaluate the hemorrhoids and consider next steps in therapy. If they are not thrombosed there is no urgent need for lancing as per our surgical colleagues. In regards to consideration of banding I reviewed with my colleagues who do banding and they do not think this will be effective or indicated at this moment due to the size and extent of these.  In regards to question of patient about sclerotherapy we do not offer that and I'm not sure if the colorectal surgeons do that either at this point, but will be a question for them - most likely will be hemorrhoidectomy but will have to defer to my surgical colleagues on this. At this moment, time and patience is the key to hoping that things become less inflammed. If he is having adequate bowel movements 1-2 times daily, is on stool softeners, fiber supplementation, and/or laxatives, with continued Sitz baths 1-4 times daily, the NTG ointment (being placed 2-times daily up to the first knuckle in the anus/rectum - in case there is fissural component to discomfort), these are all of the conservative measures that we have for therapy/treatment. There is nothing more that GI has to offer in regards to therapies. When his results returned yesterday, I completed his EGD/Colonoscopy follow up letters from pathology and they will be mailed this week (but can be seen in the Letters portion of Epic with recommendations as outlined). I hope that he continues to feel better but every patient and how their body heals is different. Thank you for seeing him in follow up later today.  Valarie Merino

## 2018-07-07 NOTE — Telephone Encounter (Signed)
Spoke with patient and he is "okay , but had to return to work".  He did apologized for missing his appointment.  Patient inquired about a referral to hematology.  Information given to patient .  Patient will call back to reschedule his procedure.

## 2018-07-07 NOTE — Telephone Encounter (Signed)
He's on my schedule today; so I can talk to him at visit. I'm going to send back to you in case he doesn't make his visit today to check back with him. As long as you see note from 11/13 we are good.

## 2018-07-08 ENCOUNTER — Telehealth: Payer: Self-pay | Admitting: Hematology

## 2018-07-08 ENCOUNTER — Encounter: Payer: Self-pay | Admitting: Hematology

## 2018-07-08 NOTE — Telephone Encounter (Signed)
Noted  

## 2018-07-08 NOTE — Telephone Encounter (Signed)
Just fyi: Ralph Lang no -showed for his appointment with me yesterday. He told my MA that he was doing "OK" and had returned to work. Unfortunately I did not get to share your note with him, but he plans to reschedule with me and I have encouraged him to reach out if he needs anything so I will review when I see him.   Thanks again!

## 2018-07-08 NOTE — Telephone Encounter (Signed)
Thank you for the update!

## 2018-07-08 NOTE — Telephone Encounter (Signed)
New referral received from Dr. Ethlyn Gallery for leukocytosis. Pt has been cld and scheduled to see Dr. Irene Limbo on 12/2 at 1pm. Pt aware to arrive 30 minutes early. Letter mailed.

## 2018-07-16 ENCOUNTER — Encounter: Payer: Self-pay | Admitting: Gastroenterology
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
## 2018-07-26 ENCOUNTER — Inpatient Hospital Stay: Payer: 59 | Attending: Hematology | Admitting: Hematology

## 2018-07-26 ENCOUNTER — Inpatient Hospital Stay: Payer: 59

## 2018-07-26 ENCOUNTER — Encounter: Payer: Self-pay | Admitting: Hematology

## 2018-07-26 ENCOUNTER — Telehealth: Payer: Self-pay

## 2018-07-26 VITALS — BP 116/67 | HR 79 | Temp 98.1°F | Resp 14 | Ht 72.0 in | Wt 154.2 lb

## 2018-07-26 DIAGNOSIS — D729 Disorder of white blood cells, unspecified: Secondary | ICD-10-CM

## 2018-07-26 DIAGNOSIS — F17210 Nicotine dependence, cigarettes, uncomplicated: Secondary | ICD-10-CM | POA: Diagnosis not present

## 2018-07-26 DIAGNOSIS — D72829 Elevated white blood cell count, unspecified: Secondary | ICD-10-CM

## 2018-07-26 DIAGNOSIS — D72828 Other elevated white blood cell count: Secondary | ICD-10-CM

## 2018-07-26 LAB — CMP (CANCER CENTER ONLY)
ALBUMIN: 4.3 g/dL (ref 3.5–5.0)
ALK PHOS: 68 U/L (ref 38–126)
ALT: 14 U/L (ref 0–44)
AST: 15 U/L (ref 15–41)
Anion gap: 9 (ref 5–15)
BILIRUBIN TOTAL: 0.3 mg/dL (ref 0.3–1.2)
BUN: 15 mg/dL (ref 6–20)
CO2: 31 mmol/L (ref 22–32)
Calcium: 10.1 mg/dL (ref 8.9–10.3)
Chloride: 103 mmol/L (ref 98–111)
Creatinine: 1.07 mg/dL (ref 0.61–1.24)
GFR, Est AFR Am: >60 mL/min (ref >60)
GFR, Estimated: >60 mL/min (ref >60)
GLUCOSE: 86 mg/dL (ref 70–99)
POTASSIUM: 4.6 mmol/L (ref 3.5–5.1)
Sodium: 143 mmol/L (ref 135–145)
TOTAL PROTEIN: 7.3 g/dL (ref 6.5–8.1)

## 2018-07-26 LAB — CBC WITH DIFFERENTIAL/PLATELET
Abs Immature Granulocytes: 0.03 10*3/uL (ref 0.00–0.07)
Basophils Absolute: 0 10*3/uL (ref 0.0–0.1)
Basophils Relative: 0 %
Eosinophils Absolute: 0.3 10*3/uL (ref 0.0–0.5)
Eosinophils Relative: 2 %
HCT: 45.7 % (ref 39.0–52.0)
HEMOGLOBIN: 15 g/dL (ref 13.0–17.0)
Immature Granulocytes: 0 %
Lymphocytes Relative: 31 %
Lymphs Abs: 3.4 10*3/uL (ref 0.7–4.0)
MCH: 28.8 pg (ref 26.0–34.0)
MCHC: 32.8 g/dL (ref 30.0–36.0)
MCV: 87.9 fL (ref 80.0–100.0)
MONO ABS: 0.7 10*3/uL (ref 0.1–1.0)
MONOS PCT: 7 %
NEUTROS ABS: 6.6 10*3/uL (ref 1.7–7.7)
Neutrophils Relative %: 60 %
Platelets: 132 10*3/uL — ABNORMAL LOW (ref 150–400)
RBC: 5.2 MIL/uL (ref 4.22–5.81)
RDW: 12.6 % (ref 11.5–15.5)
WBC: 11 10*3/uL — ABNORMAL HIGH (ref 4.0–10.5)
nRBC: 0 % (ref 0.0–0.2)

## 2018-07-26 LAB — LACTATE DEHYDROGENASE: LDH: 219 U/L — ABNORMAL HIGH (ref 98–192)

## 2018-07-26 NOTE — Patient Instructions (Signed)
Thank you for choosing [facility] to provide your oncology and hematology care.  To afford each patient quality time with our providers, please arrive 30 minutes before your scheduled appointment time.  If you arrive late for your appointment, you may be asked to reschedule.  We strive to give you quality time with our providers, and arriving late affects you and other patients whose appointments are after yours.    If you are a no show for multiple scheduled visits, you may be dismissed from the clinic at the providers discretion.     Again, thank you for choosing [facility], our hope is that these requests will decrease the amount of time that you wait before being seen by our physicians.  ______________________________________________________________________   Should you have questions after your visit to the [facility], please contact our office at [phone] between the hours of 8:30 and 4:30 p.m.    Voicemails left after 4:30p.m will not be returned until the following business day.     For prescription refill requests, please have your pharmacy contact us directly.  Please also try to allow 48 hours for prescription requests.     Please contact the scheduling department for questions regarding scheduling.  For scheduling of procedures such as PET scans, CT scans, MRI, Ultrasound, etc please contact central scheduling at [phone].     Resources For Cancer Patients and Caregivers:    Oncolink.org:  A wonderful resource for patients and healthcare providers for information regarding your disease, ways to tract your treatment, what to expect, etc.      American Cancer Society:  800-227-2345  Can help patients locate various types of support and financial assistance   Cancer Care: 1-800-813-HOPE (4673) Provides financial assistance, online support groups, medication/co-pay assistance.     [place] DSS:  336-641-3447 Where to apply  for food stamps, Medicaid, and utility assistance   Medicare Rights Center: 800-333-4114 Helps people with Medicare understand their rights and benefits, navigate the Medicare system, and secure the quality healthcare they deserve   SCAT: 336-333-6589  [employer]'s shared-ride transportation service for eligible riders who have a disability that prevents them from riding the fixed route bus.     For additional information on assistance programs please contact our social worker:   Abigail Elmore:  [phone]  

## 2018-07-26 NOTE — Telephone Encounter (Signed)
Printed avs and calender of upcoming appointment. Per 12/2 los 

## 2018-08-06 LAB — BCR ABL1 FISH (GENPATH)

## 2019-03-08 ENCOUNTER — Telehealth: Payer: Self-pay | Admitting: Family Medicine

## 2019-03-08 ENCOUNTER — Encounter: Payer: Self-pay | Admitting: Family Medicine

## 2019-03-08 ENCOUNTER — Ambulatory Visit (INDEPENDENT_AMBULATORY_CARE_PROVIDER_SITE_OTHER): Payer: No Typology Code available for payment source | Admitting: Family Medicine

## 2019-03-08 ENCOUNTER — Other Ambulatory Visit: Payer: Self-pay

## 2019-03-08 VITALS — BP 112/70 | HR 72 | Temp 98.0°F | Ht 72.0 in | Wt 158.5 lb

## 2019-03-08 DIAGNOSIS — M5432 Sciatica, left side: Secondary | ICD-10-CM

## 2019-03-08 DIAGNOSIS — M62830 Muscle spasm of back: Secondary | ICD-10-CM | POA: Diagnosis not present

## 2019-03-08 MED ORDER — METHOCARBAMOL 500 MG PO TABS: 500.0000 mg | ORAL_TABLET | Freq: Three times a day (TID) | ORAL | 0 refills | Status: DC | PRN

## 2019-03-08 MED ORDER — PREDNISONE 10 MG PO TABS: ORAL_TABLET | ORAL | 0 refills | Status: DC

## 2019-03-08 NOTE — Patient Instructions (Signed)

## 2019-03-08 NOTE — Progress Notes (Signed)
Subjective:     Patient ID: Ralph Lang, male   DOB: Jan 03, 1976, 43 y.o.   MRN: [id number]  HPI Patient states he has had some chronic back pain since around age 47.  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  He denies any specific injury.  He had onset Saturday of pain left lower lumbar area which is sharp and radiates down occasionally to the foot.  Occasional tingling lower extremities.  No definite weakness.  No urine or stool incontinence.  He has had previous MRI of this about 8 years ago.  He has tried some topical heat without much improvement.  He states he has pain with walking but also difficulties with changing positions.  Pain is severe at times.  He is seen orthopedic practice in the past and apparently has had several injections in his back without much improvement.  He is no longer under orthopedic care.  Past Medical History:  Diagnosis Date  . Anxiety   . GERD (gastroesophageal reflux disease)   . Kidney stone   . Migraine   . Panic attack    Past Surgical History:  Procedure Laterality Date  . APPENDECTOMY    . COLONOSCOPY WITH ESOPHAGOGASTRODUODENOSCOPY (EGD)  06/30/2018  . CYST REMOVAL HAND    . CYST REMOVAL NECK    . LACERATION REPAIR Right    wrist    reports that he has been smoking cigarettes. He has never used smokeless tobacco. He reports current alcohol use. He reports current drug use. Drug: Marijuana. family history includes Alzheimer's disease in his maternal grandmother; Colon cancer in his paternal uncle; Hypertension in his father and mother; Migraines in his brother, maternal grandmother, and mother. No Known Allergies   Review of Systems  Constitutional: Negative for activity change, appetite change and fever.  Respiratory: Negative for cough and shortness of breath.   Cardiovascular: Negative for chest pain and leg swelling.  Gastrointestinal: Negative for abdominal pain and  vomiting.  Genitourinary: Negative for dysuria, flank pain and hematuria.  Musculoskeletal: Positive for back pain. Negative for joint swelling.  Skin: Negative for rash.  Neurological: Negative for weakness.       Objective:   Physical Exam Constitutional:      General: He is not in acute distress.    Appearance: He is well-developed.  Neck:     Thyroid: No thyromegaly.  Cardiovascular:     Rate and Rhythm: Normal rate and regular rhythm.     Heart sounds: Normal heart sounds. No murmur.  Pulmonary:     Effort: Pulmonary effort is normal. No respiratory distress.     Breath sounds: Normal breath sounds. No wheezing or rales.  Musculoskeletal:     Right lower leg: No edema.     Left lower leg: No edema.  Skin:    Findings: No rash.  Neurological:     Mental Status: He is alert and oriented to person, place, and time.     Cranial Nerves: No cranial nerve deficit.     Deep Tendon Reflexes: Reflexes are normal and symmetric.     Comments: Strength testing lower extremities reveals poor effort bilaterally.  He has 2+ ankle and knee reflexes bilaterally with normal sensory function throughout        Assessment:     Acute on chronic low back pain with left-sided radiculitis symptoms.  Nonfocal neuro exam.    Plan:     -Recommend trial of  prednisone taper and reviewed potential side effects -Robaxin 500 mg every 8 hours as needed for muscle spasm -Continue heat or ice for symptom relief -Reviewed extension stretches for low back -Consider referral back to back specialist if symptoms persist -We did write for light work duty through 03/22/2019  Eulas Post MD [facility]

## 2019-03-08 NOTE — Telephone Encounter (Signed)
Patient is wanting to have a follow up with Dr. Ethlyn Gallery about back pain. Patient would like to see if he could be seen after July 24 is possible. Please advise.

## 2019-03-10 NOTE — Telephone Encounter (Signed)
Unable to leave a message due to no voicemail-will attempt to call back later.

## 2019-03-24 ENCOUNTER — Telehealth: Payer: No Typology Code available for payment source | Admitting: Family Medicine

## 2019-03-24 ENCOUNTER — Other Ambulatory Visit: Payer: Self-pay

## 2019-03-24 ENCOUNTER — Encounter: Payer: Self-pay | Admitting: Family Medicine

## 2019-03-24 DIAGNOSIS — Z91199 Patient's noncompliance with other medical treatment and regimen due to unspecified reason: Secondary | ICD-10-CM

## 2019-03-24 DIAGNOSIS — G8929 Other chronic pain: Secondary | ICD-10-CM

## 2019-03-24 DIAGNOSIS — Z5329 Procedure and treatment not carried out because of patient's decision for other reasons: Secondary | ICD-10-CM

## 2019-03-24 NOTE — Progress Notes (Signed)
No show. Attempted to contact pt via the telemedicine platform - resent link. Also tried to contact by phone with no answer and VM box full. Sent message to assistant as well attempt to contact pt to reschedule if needed.

## 2019-03-28 NOTE — Addendum Note (Signed)
Addended by: Agnes Lawrence on: 03/28/2019 04:21 PM   Modules accepted: Orders

## 2019-03-28 NOTE — Progress Notes (Signed)
I called the pt and he stated he had to work and is not sure how a virtual visit will help with his back pain.  Stated he feels he needs an MRI.  Patient was seen by Dr Elease Hashimoto on 7/14 and the note indicates a referral to a back specialist if pain persists and the pt stated he would prefer this.  Message sent to Dr Ethlyn Gallery to review for referral.

## 2019-03-28 NOTE — Progress Notes (Signed)
Referral placed and I called the pt and informed him someone will call with appt info.

## 2019-03-28 NOTE — Progress Notes (Signed)
Ok for referral?

## 2019-04-12 ENCOUNTER — Ambulatory Visit: Payer: Self-pay

## 2019-04-12 ENCOUNTER — Ambulatory Visit (INDEPENDENT_AMBULATORY_CARE_PROVIDER_SITE_OTHER): Payer: No Typology Code available for payment source | Admitting: Orthopaedic Surgery

## 2019-04-12 ENCOUNTER — Encounter: Payer: Self-pay | Admitting: Orthopaedic Surgery

## 2019-04-12 VITALS — BP 118/77 | HR 74 | Ht 73.0 in | Wt 160.0 lb

## 2019-04-12 DIAGNOSIS — M545 Low back pain, unspecified: Secondary | ICD-10-CM

## 2019-04-12 DIAGNOSIS — M7702 Medial epicondylitis, left elbow: Secondary | ICD-10-CM | POA: Diagnosis not present

## 2019-04-12 DIAGNOSIS — M25522 Pain in left elbow: Secondary | ICD-10-CM | POA: Diagnosis not present

## 2019-04-12 DIAGNOSIS — M5126 Other intervertebral disc displacement, lumbar region: Secondary | ICD-10-CM | POA: Insufficient documentation

## 2019-04-12 DIAGNOSIS — G8929 Other chronic pain: Secondary | ICD-10-CM

## 2019-04-12 HISTORY — DX: Medial epicondylitis, left elbow: M77.02

## 2019-04-12 MED ORDER — PREDNISONE 10 MG PO TABS: ORAL_TABLET | ORAL | 0 refills | Status: DC

## 2019-04-12 NOTE — Progress Notes (Signed)
Office Visit Note   Patient: Ralph Lang           Date of Birth: 06-Sep-1975           MRN: [id number] Visit Date: 04/12/2019              Requested by: Caren Macadam, MD [place],  [place] [id number] PCP: Caren Macadam, MD   Assessment & Plan: Visit Diagnoses:  1. Chronic left-sided low back pain, unspecified whether sciatica present   2. Pain in left elbow   3. Medial epicondylitis of elbow, left   4. Protrusion of lumbar intervertebral disc            Hx of left elbow dislocation  Plan: Tennis elbow brace applied for medial epicondylitis left elbow pathophysiology discussed.  Will obtain a repeat MRI scan with his 10-year history of lumbar disc protrusions with progression on plain radiographs at L4-5 and L5-S1 office follow-up after scan for review.  Follow-Up Instructions: Office follow-up after lumbar MRI scan.  Orders:  Orders Placed This Encounter  Procedures  . XR Pelvis 1-2 Views  . XR Lumbar Spine 2-3 Views  . XR Elbow 2 Views Left  . MR Lumbar Spine w/o contrast  . DG Eye Foreign Body   Meds ordered this encounter  Medications  . predniSONE (DELTASONE) 10 MG tablet    Sig: Take 2 daily with food for 3 days then one tablet daiy    Dispense:  21 tablet    Refill:  0      Procedures: No procedures performed   Clinical Data: No additional findings.   Subjective: Chief Complaint  Patient presents with  . Lower Back - Pain  . Left Hip - Pain  . Left Elbow - Pain    HPI 43 year old male returns with chronic back pain and some 10 years ago MRI scan showed prominent disc protrusion and L5-S1 and also some disc degeneration at L4-5.  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  He has had problems when he bends over or tries to pick up his daughter has trouble that wakes him up in the middle the night with pain that radiates from his back more into his left hip and the  right hip.  He also has a remote history of elbow dislocation  5 to 6 years ago and has had significant increased pain with gripping squeezing and points to the medial epicondyle where he has pain.  Patient's work for the same company since age 48.  Increasing problems doing his job due to his back pain as well as left elbow pain.  Patient denies fever chills.  No associated bowel or bladder symptoms.  Patient been treated with therapy in the past with history of more than 5 epidurals with temporary relief with most recent epidurals not effective.  He has had recent prednisone pack, anti-inflammatories, Robaxin muscle relaxants all without improvement.  Review of Systems positive for anxiety, smoker, elbow discussion 5 to 6 years ago.  Lumbar disc degeneration with protrusion and partial extrusion and L5-S1.  Negative for stroke chest pain.   Objective: Vital Signs: BP 118/77   Pulse 74   Ht 6\' 1"  (1.854 m)   Wt 160 lb (72.6 kg)   BMI 21.11 kg/m   Physical Exam Constitutional:      Appearance: He is well-developed.  HENT:     Head:  Normocephalic and atraumatic.  Eyes:     Pupils: Pupils are equal, round, and reactive to light.  Neck:     Thyroid: No thyromegaly.     Trachea: No tracheal deviation.  Cardiovascular:     Rate and Rhythm: Normal rate.  Pulmonary:     Effort: Pulmonary effort is normal.     Breath sounds: No wheezing.  Abdominal:     General: Bowel sounds are normal.     Palpations: Abdomen is soft.  Skin:    General: Skin is warm and dry.     Capillary Refill: Capillary refill takes less than 2 seconds.  Neurological:     Mental Status: He is alert and oriented to person, place, and time.  Psychiatric:        Behavior: Behavior normal.        Thought Content: Thought content normal.        Judgment: Judgment normal.     Ortho Exam patient has exquisite tenderness left medial epicondyle and withdraws with pain.  Ulnar nerve is stable in the groove no  interosseous weakness in his hand.  No brachial plexus tenderness left or right.  Normal hip range of motion negative logroll.  He has pain with straight leg raising on the left negative on the right.  He is able to heel and toe walk.  Increased pain with forward flexion.  Anterior tib is strong he has some left EHL weakness on the left normal on the right.  Specialty Comments:  No specialty comments available.  Imaging: MRI summary 2012  IMPRESSION:  1.  Shallow central disc protrusion at L4-5 without significant stenosis. 2.  Left paracentral disc protrusion at L5-S1 with lateral recess narrowing and probable mass effect on the left S1 nerve root. 3.  Inferior disc extrusion at potential disc fragment centrally into the right at L5-S1 creates some mass effect on the right lateral recess as well. 4.  Slight retrolisthesis at L4-5 and anterolisthesis at L5-S1 with suggested bilateral L5 pars defects.  Original Report Authenticated By: Resa Miner. MATTERN, M.D.  PMFS History: Patient Active Problem List   Diagnosis Date Noted  . Medial epicondylitis of elbow, left 04/12/2019  . Protrusion of lumbar intervertebral disc 04/12/2019  . Genital warts 07/02/2018  . Migraine 07/02/2018  . Midepigastric pain 06/14/2018  . Unintentional weight loss 06/14/2018  . Early satiety 06/14/2018  . Food aversion 06/14/2018  . Generalized anxiety disorder 07/26/2013  . Smoker 07/26/2013   Past Medical History:  Diagnosis Date  . Anxiety   . GERD (gastroesophageal reflux disease)   . Kidney stone   . Migraine   . Panic attack     Family History  Problem Relation Age of Onset  . Hypertension Mother   . Migraines Mother   . Hypertension Father   . Migraines Brother   . Migraines Maternal Grandmother   . Alzheimer's disease Maternal Grandmother   . Colon cancer Paternal Uncle   . Esophageal cancer Neg Hx   . Inflammatory bowel disease Neg Hx   . Liver disease Neg Hx   .  Pancreatic cancer Neg Hx   . Rectal cancer Neg Hx   . Stomach cancer Neg Hx     Past Surgical History:  Procedure Laterality Date  . APPENDECTOMY    . COLONOSCOPY WITH ESOPHAGOGASTRODUODENOSCOPY (EGD)  06/30/2018  . CYST REMOVAL HAND    . CYST REMOVAL NECK    . LACERATION REPAIR Right    wrist  Social History   Occupational History  . Occupation: Engineer, building services  Tobacco Use  . Smoking status: Current Every Day Smoker    Types: Cigarettes  . Smokeless tobacco: Never Used  Substance and Sexual Activity  . Alcohol use: Yes    Comment: once a month  . Drug use: Yes    Types: Marijuana    Comment: last had 3-4 weeks ago  . Sexual activity: Yes    Partners: Female

## 2019-05-05 ENCOUNTER — Other Ambulatory Visit: Payer: Self-pay

## 2019-05-05 ENCOUNTER — Ambulatory Visit
Admission: RE | Admit: 2019-05-05 | Discharge: 2019-05-05 | Disposition: A | Discharge status: Home / Self Care | Payer: No Typology Code available for payment source | Source: Ambulatory Visit | Attending: Orthopaedic Surgery | Admitting: Orthopaedic Surgery

## 2019-05-05 DIAGNOSIS — G8929 Other chronic pain: Secondary | ICD-10-CM

## 2019-05-05 DIAGNOSIS — M545 Low back pain, unspecified: Secondary | ICD-10-CM

## 2019-05-10 ENCOUNTER — Ambulatory Visit (INDEPENDENT_AMBULATORY_CARE_PROVIDER_SITE_OTHER): Payer: No Typology Code available for payment source | Admitting: Orthopaedic Surgery

## 2019-05-10 ENCOUNTER — Encounter: Payer: Self-pay | Admitting: Orthopaedic Surgery

## 2019-05-10 ENCOUNTER — Other Ambulatory Visit: Payer: Self-pay

## 2019-05-10 VITALS — BP 117/68 | HR 82 | Ht 73.0 in | Wt 160.0 lb

## 2019-05-10 DIAGNOSIS — M5126 Other intervertebral disc displacement, lumbar region: Secondary | ICD-10-CM | POA: Diagnosis not present

## 2019-05-10 NOTE — Addendum Note (Signed)
Addended by: Meyer Cory on: 05/10/2019 09:21 AM   Modules accepted: Orders

## 2019-05-10 NOTE — Progress Notes (Signed)
Office Visit Note   Patient: Ralph Lang           Date of Birth: 1975-12-17           MRN: [id number] Visit Date: 05/10/2019              Requested by: Caren Macadam, MD [place],  [place] [address] PCP: Caren Macadam, MD   Assessment & Plan: Visit Diagnoses:  1. Protrusion of lumbar intervertebral disc     Plan: Lumbar MRI scan shows two-level disc desiccation with small protrusion.  His MRI scan actually shows less protrusion at L5-S1 since 2012 images.  L4-5 also shows slight left disc protrusion.  He has no foraminal stenosis.  He has had progressive loss of disc space height at L5S1.  Paracentral disc protrusion more prominent at L5-S1 than at L4-5.  Patient like to repeat epidural injection since previously he got over a years relief with the injection.  We reviewed the previous MRI as well as 05/05/2019 MRI again with copy the report.  Discussed Would not recommend surgical intervention at this point and I can check him back in 4 months we will seek approval for epidural injection on the left side for persistent radicular symptoms.  Follow-Up Instructions: Return in about 4 months (around 09/09/2019).   Orders:  No orders of the defined types were placed in this encounter.  No orders of the defined types were placed in this encounter.     Procedures: No procedures performed   Clinical Data: No additional findings.   Subjective: Chief Complaint  Patient presents with   Lower Back - Pain, Follow-up    MRI Lumbar Review    HPI 43 year old male returns with ongoing problems with back pain and left leg pain intermittent episodic.  He states when he picks his children up or if he turns or twists does some shoveling was more active at work he has increased pain.  He gets relief with supine position.  Patient's had increased symptoms prednisone pack did not give him relief.  He denies bowel bladder symptoms no fever or chills.   Patient is been treated with anti-inflammatories exercise program physical therapy in the past.  MRI scan is been obtained and is available for review today.  Review of Systems 14 point update unchanged from 04/12/2019 office visit other than as mentioned in HPI.   Objective: Vital Signs: BP 117/68    Pulse 82    Ht 6\' 1"  (1.854 m)    Wt 160 lb (72.6 kg)    BMI 21.11 kg/m   Physical Exam Constitutional:      Appearance: He is well-developed.  HENT:     Head: Normocephalic and atraumatic.  Eyes:     Pupils: Pupils are equal, round, and reactive to light.  Neck:     Thyroid: No thyromegaly.     Trachea: No tracheal deviation.  Cardiovascular:     Rate and Rhythm: Normal rate.  Pulmonary:     Effort: Pulmonary effort is normal.     Breath sounds: No wheezing.  Abdominal:     General: Bowel sounds are normal.     Palpations: Abdomen is soft.  Skin:    General: Skin is warm and dry.     Capillary Refill: Capillary refill takes less than 2 seconds.  Neurological:     Mental Status: He is alert and oriented to person, place, and time.  Psychiatric:  Behavior: Behavior normal.        Thought Content: Thought content normal.        Judgment: Judgment normal.     Ortho Exam patient has sciatic notch tenderness on the left negative Faber test negative logroll of the hips.  Good cervical range of motion.  Full flexion-extension of the elbow.  Anterior tib on the left are strong he has trace each EHL weakness on the left normal on the right gastrocsoleus is strong.  Specialty Comments:  No specialty comments available.  Imaging: CLINICAL DATA:  Chronic low back pain and weakness in left leg. No history of surgery.  EXAM: MRI LUMBAR SPINE WITHOUT CONTRAST  TECHNIQUE: Multiplanar, multisequence MR imaging of the lumbar spine was performed. No intravenous contrast was administered.  COMPARISON:  11/03/2010 MRI  FINDINGS: Segmentation: There are five lumbar type  vertebral bodies. The last full intervertebral disc space is labeled L5-S1. This correlates with the prior MRI.  Alignment: Very slight anterolisthesis of L5. Unilateral pars defect noted on the right. The other lumbar vertebral bodies are normally aligned.  Vertebrae: Normal marrow signal. No bone lesions or fractures. Mild endplate reactive changes at T12-L1 and L5-S1.  Conus medullaris and cauda equina: Conus extends to the L1 level. Conus and cauda equina appear normal.  Paraspinal and other soft tissues: No significant paraspinal or retroperitoneal findings.  Disc levels:  T12-L1: No significant findings.  L1-2: No significant findings.  L2-3: No significant findings.  L3-4: No significant findings.  L4-5: Slight bulging uncovered disc along with a shallow residual disc protrusion but no neural compression. The canal is fairly generous. No lateral recess or foraminal stenosis.  L5-S1: Slightly progressive degenerative disc disease at this level with disc desiccation, disc space narrowing and endplate reactive changes. The sizable disc protrusion seen on the prior study demonstrates desiccation and retraction. There is a shallow residual central and slightly left paracentral disc protrusion but the spinal canal is fairly generous in is no significant spinal, lateral recess or foraminal stenosis. Mild facet disease, left greater than right.  IMPRESSION: 1. Interval desiccation and retraction of disc protrusions at L4-5 and L5-S1. Residual shallow disc protrusions but no significant neural compression. 2. The other intervertebral disc spaces are unremarkable and stable. 3. Unilateral pars defect on the right at L5 with a very slight anterolisthesis. 4. Progressive degenerative disc disease at L5-S1.   Electronically Signed   By: Marijo Sanes M.D.   On: 05/05/2019 14:47   PMFS History: Patient Active Problem List   Diagnosis Date Noted    Medial epicondylitis of elbow, left 04/12/2019   Protrusion of lumbar intervertebral disc 04/12/2019   Genital warts 07/02/2018   Migraine 07/02/2018   Midepigastric pain 06/14/2018   Unintentional weight loss 06/14/2018   Early satiety 06/14/2018   Food aversion 06/14/2018   Generalized anxiety disorder 07/26/2013   Smoker 07/26/2013   Past Medical History:  Diagnosis Date   Anxiety    GERD (gastroesophageal reflux disease)    Kidney stone    Migraine    Panic attack     Family History  Problem Relation Age of Onset   Hypertension Mother    Migraines Mother    Hypertension Father    Migraines Brother    Migraines Maternal Grandmother    Alzheimer's disease Maternal Grandmother    Colon cancer Paternal Uncle    Esophageal cancer Neg Hx    Inflammatory bowel disease Neg Hx    Liver disease Neg Hx  Pancreatic cancer Neg Hx    Rectal cancer Neg Hx    Stomach cancer Neg Hx     Past Surgical History:  Procedure Laterality Date   APPENDECTOMY     COLONOSCOPY WITH ESOPHAGOGASTRODUODENOSCOPY (EGD)  06/30/2018   CYST REMOVAL HAND     CYST REMOVAL NECK     LACERATION REPAIR Right    wrist   Social History   Occupational History   Occupation: Engineer, building services  Tobacco Use   Smoking status: Current Every Day Smoker    Types: Cigarettes   Smokeless tobacco: Never Used  Substance and Sexual Activity   Alcohol use: Yes    Comment: once a month   Drug use: Yes    Types: Marijuana    Comment: last had 3-4 weeks ago   Sexual activity: Yes    Partners: Female

## 2019-05-24 ENCOUNTER — Ambulatory Visit: Payer: Self-pay

## 2019-05-24 ENCOUNTER — Encounter: Payer: Self-pay | Admitting: Physical Medicine and Rehabilitation

## 2019-05-24 ENCOUNTER — Ambulatory Visit (INDEPENDENT_AMBULATORY_CARE_PROVIDER_SITE_OTHER): Payer: No Typology Code available for payment source | Admitting: Physical Medicine and Rehabilitation

## 2019-05-24 VITALS — BP 116/72 | HR 83

## 2019-05-24 DIAGNOSIS — M5416 Radiculopathy, lumbar region: Secondary | ICD-10-CM

## 2019-05-24 DIAGNOSIS — M5116 Intervertebral disc disorders with radiculopathy, lumbar region: Secondary | ICD-10-CM

## 2019-05-24 MED ORDER — METHYLPREDNISOLONE ACETATE 80 MG/ML IJ SUSP
80.0000 mg | Freq: Once | INTRAMUSCULAR | Status: AC
  Administered 2019-05-24: 80 mg

## 2019-05-24 NOTE — Progress Notes (Signed)
 .  Numeric Pain Rating Scale and Functional Assessment Average Pain 5   In the last MONTH (on 0-10 scale) has pain interfered with the following?  1. General activity like being  able to carry out your everyday physical activities such as walking, climbing stairs, carrying groceries, or moving a chair?  Rating(6)   +Driver, -BT, -Dye Allergies.  

## 2019-05-25 NOTE — Procedures (Signed)
Lumbar Epidural Steroid Injection - Interlaminar Approach with Fluoroscopic Guidance  Patient: Ralph Lang      Date of Birth: December 09, 1975 MRN: [id number] PCP: Caren Macadam, MD      Visit Date: 05/24/2019   Universal Protocol:     Consent Given By: the patient  Position: PRONE  Additional Comments: Vital signs were monitored before and after the procedure. Patient was prepped and draped in the usual sterile fashion. The correct patient, procedure, and site was verified.   Injection Procedure Details:  Procedure Site One Meds Administered:  Meds ordered this encounter  Medications  . methylPREDNISolone acetate (DEPO-MEDROL) injection 80 mg     Laterality: Right  Location/Site:  L5-S1  Needle size: 20 G  Needle type: Tuohy  Needle Placement: Paramedian epidural  Findings:   -Comments: Excellent flow of contrast into the epidural space.  Procedure Details: Using a paramedian approach from the side mentioned above, the region overlying the inferior lamina was localized under fluoroscopic visualization and the soft tissues overlying this structure were infiltrated with 4 ml. of 1% Lidocaine without Epinephrine. The Tuohy needle was inserted into the epidural space using a paramedian approach.   The epidural space was localized using loss of resistance along with lateral and bi-planar fluoroscopic views.  After negative aspirate for air, blood, and CSF, a 2 ml. volume of Isovue-250 was injected into the epidural space and the flow of contrast was observed. Radiographs were obtained for documentation purposes.    The injectate was administered into the level noted above.   Additional Comments:  The patient tolerated the procedure well Dressing: 2 x 2 sterile gauze and Band-Aid    Post-procedure details: Patient was observed during the procedure. Post-procedure instructions were reviewed.  Patient left the clinic in stable condition.

## 2019-05-25 NOTE — Progress Notes (Signed)
[name] - 43 y.o. male MRN [id number]  Date of birth: 1976-02-06  Office Visit Note: Visit Date: 05/24/2019 PCP: Caren Macadam, MD Referred by: Caren Macadam, MD  Subjective: Chief Complaint  Patient presents with  . Lower Back - Pain   HPI:  [name] is a 43 y.o. male who comes in today For planned left L5-S1 interlaminar epidural steroid injection.  This is at the request of Dr. Rodell Perna.  Patient had prior injections by Dr. Normajean Glasgow many years ago and did seem to help quite a bit for a few years.  Updated MRI actually shows some regression of disc herniations that were present at that point.  Patient still having a lot of back pain with left radicular type buttock pain.  He has a very young daughter and he is really complaining that he is unable to really pick her up a lot of times do his back pain.  He also relates it to his work as well.  We did talk about at length the use of cortisone type medications and corticosteroids in the good and bad and ugly of those.  He had some relief in the way those works that was pretty erroneous and we did try to educate him on those.  Secondarily is been complaining about his left elbow with a history of epicondylitis.  He reports this is actually gotten worse and he would like to see someone in follow-up for that.  He says that he did not know he was seeing a different physician today and did not know he was seeing me instead of Dr. Inda Merlin.  I will get him follow-up with Benjiman Core do to Dr. Lorin Mercy being out for surgery right now.  ROS Otherwise per HPI.  Assessment & Plan: Visit Diagnoses:  1. Lumbar radiculopathy   2. Radiculopathy due to lumbar intervertebral disc disorder     Plan: No additional findings.   Meds & Orders:  Meds ordered this encounter  Medications  . methylPREDNISolone acetate (DEPO-MEDROL) injection 80 mg    Orders Placed This Encounter  Procedures  . XR C-ARM NO REPORT  . Epidural  Steroid injection    Follow-up: Return for Benjiman Core, PA-C for his left elbow.   Procedures: No procedures performed  Lumbar Epidural Steroid Injection - Interlaminar Approach with Fluoroscopic Guidance  Patient: Ralph Lang      Date of Birth: 04/21/1976 MRN: [id number] PCP: Caren Macadam, MD      Visit Date: 05/24/2019   Universal Protocol:     Consent Given By: the patient  Position: PRONE  Additional Comments: Vital signs were monitored before and after the procedure. Patient was prepped and draped in the usual sterile fashion. The correct patient, procedure, and site was verified.   Injection Procedure Details:  Procedure Site One Meds Administered:  Meds ordered this encounter  Medications  . methylPREDNISolone acetate (DEPO-MEDROL) injection 80 mg     Laterality: Right  Location/Site:  L5-S1  Needle size: 20 G  Needle type: Tuohy  Needle Placement: Paramedian epidural  Findings:   -Comments: Excellent flow of contrast into the epidural space.  Procedure Details: Using a paramedian approach from the side mentioned above, the region overlying the inferior lamina was localized under fluoroscopic visualization and the soft tissues overlying this structure were infiltrated with 4 ml. of 1% Lidocaine without Epinephrine. The Tuohy needle was inserted into the epidural space using a paramedian approach.   The epidural  space was localized using loss of resistance along with lateral and bi-planar fluoroscopic views.  After negative aspirate for air, blood, and CSF, a 2 ml. volume of Isovue-250 was injected into the epidural space and the flow of contrast was observed. Radiographs were obtained for documentation purposes.    The injectate was administered into the level noted above.   Additional Comments:  The patient tolerated the procedure well Dressing: 2 x 2 sterile gauze and Band-Aid    Post-procedure details: Patient was observed during  the procedure. Post-procedure instructions were reviewed.  Patient left the clinic in stable condition.   Clinical History: EXAM: MRI LUMBAR SPINE WITHOUT CONTRAST  TECHNIQUE: Multiplanar, multisequence MR imaging of the lumbar spine was performed. No intravenous contrast was administered.  COMPARISON:  11/03/2010 MRI  FINDINGS: Segmentation: There are five lumbar type vertebral bodies. The last full intervertebral disc space is labeled L5-S1. This correlates with the prior MRI.  Alignment: Very slight anterolisthesis of L5. Unilateral pars defect noted on the right. The other lumbar vertebral bodies are normally aligned.  Vertebrae: Normal marrow signal. No bone lesions or fractures. Mild endplate reactive changes at T12-L1 and L5-S1.  Conus medullaris and cauda equina: Conus extends to the L1 level. Conus and cauda equina appear normal.  Paraspinal and other soft tissues: No significant paraspinal or retroperitoneal findings.  Disc levels:  T12-L1: No significant findings.  L1-2: No significant findings.  L2-3: No significant findings.  L3-4: No significant findings.  L4-5: Slight bulging uncovered disc along with a shallow residual disc protrusion but no neural compression. The canal is fairly generous. No lateral recess or foraminal stenosis.  L5-S1: Slightly progressive degenerative disc disease at this level with disc desiccation, disc space narrowing and endplate reactive changes. The sizable disc protrusion seen on the prior study demonstrates desiccation and retraction. There is a shallow residual central and slightly left paracentral disc protrusion but the spinal canal is fairly generous in is no significant spinal, lateral recess or foraminal stenosis. Mild facet disease, left greater than right.  IMPRESSION: 1. Interval desiccation and retraction of disc protrusions at L4-5 and L5-S1. Residual shallow disc protrusions but no  significant neural compression. 2. The other intervertebral disc spaces are unremarkable and stable. 3. Unilateral pars defect on the right at L5 with a very slight anterolisthesis. 4. Progressive degenerative disc disease at L5-S1.   Electronically Signed   By: Marijo Sanes M.D.   On: 05/05/2019 14:47     Objective:  VS:  HT:    WT:   BMI:     BP:116/72  HR:83bpm  TEMP: ( )  RESP:  Physical Exam  Ortho Exam Imaging: Xr C-arm No Report  Result Date: 05/24/2019 Please see Notes tab for imaging impression.

## 2019-06-01 ENCOUNTER — Ambulatory Visit: Payer: No Typology Code available for payment source | Admitting: Surgery

## 2019-07-13 ENCOUNTER — Telehealth (INDEPENDENT_AMBULATORY_CARE_PROVIDER_SITE_OTHER): Payer: No Typology Code available for payment source | Admitting: Family Medicine

## 2019-07-13 ENCOUNTER — Encounter: Payer: Self-pay | Admitting: Family Medicine

## 2019-07-13 ENCOUNTER — Other Ambulatory Visit: Payer: Self-pay

## 2019-07-13 DIAGNOSIS — B029 Zoster without complications: Secondary | ICD-10-CM | POA: Diagnosis not present

## 2019-07-13 MED ORDER — VALACYCLOVIR HCL 1 G PO TABS: 1000.0000 mg | ORAL_TABLET | Freq: Three times a day (TID) | ORAL | 0 refills | Status: DC

## 2019-07-13 NOTE — Progress Notes (Signed)
Virtual Visit via Video Note  I connected with the patient on 07/13/19 at 11:00 AM EST by a video enabled telemedicine application and verified that I am speaking with the correct person using two identifiers.  Location patient: home Location provider:work or home office Persons participating in the virtual visit: patient, provider  I discussed the limitations of evaluation and management by telemedicine and the availability of in person appointments. The patient expressed understanding and agreed to proceed.   HPI: Here for 4 weeks of a tender scabbed area on the right scalp along with increased sensitivity of the skin on the right side of the head and burning pains in the right side of the head, the right ear, and the right neck. No fever or ST or cough. He has never had this before. His typical migraines fell very different from this and they occur on the other side of the head. No vision changes.    ROS: See pertinent positives and negatives per HPI.  Past Medical History:  Diagnosis Date  . Anxiety   . GERD (gastroesophageal reflux disease)   . Kidney stone   . Migraine   . Panic attack     Past Surgical History:  Procedure Laterality Date  . APPENDECTOMY    . COLONOSCOPY WITH ESOPHAGOGASTRODUODENOSCOPY (EGD)  06/30/2018  . CYST REMOVAL HAND    . CYST REMOVAL NECK    . LACERATION REPAIR Right    wrist    Family History  Problem Relation Age of Onset  . Hypertension Mother   . Migraines Mother   . Hypertension Father   . Migraines Brother   . Migraines Maternal Grandmother   . Alzheimer's disease Maternal Grandmother   . Colon cancer Paternal Uncle   . Esophageal cancer Neg Hx   . Inflammatory bowel disease Neg Hx   . Liver disease Neg Hx   . Pancreatic cancer Neg Hx   . Rectal cancer Neg Hx   . Stomach cancer Neg Hx      Current Outpatient Medications:  .  alprazolam (XANAX) 2 MG tablet, Take 2 mg by mouth 2 (two) times daily. , Disp: , Rfl:  .  ALPRAZOLAM  XR 0.5 MG 24 hr tablet, Take 0.5 mg by mouth 2 (two) times daily., Disp: , Rfl:  .  Aspirin-Acetaminophen-Caffeine (EXCEDRIN PO), Take 3 tablets by mouth daily as needed (headache/migraine). , Disp: , Rfl:  .  methocarbamol (ROBAXIN) 500 MG tablet, Take 1 tablet (500 mg total) by mouth every 8 (eight) hours as needed for muscle spasms., Disp: 30 tablet, Rfl: 0 .  omeprazole (PRILOSEC) 40 MG capsule, Take 1 capsule (40 mg total) by mouth 2 (two) times daily., Disp: 60 capsule, Rfl: 3 .  predniSONE (DELTASONE) 10 MG tablet, Take 2 daily with food for 3 days then one tablet daiy, Disp: 21 tablet, Rfl: 0 .  SUMAtriptan (IMITREX) 100 MG tablet, Take 1 tab every 2 hrs for migraine. Repeat in 2 hrs if needed. PATIENT NEEDS OFFICE VISIT FOR ADDITIONAL REFILLS (Patient taking differently: Take 1/2 tab every 2 hrs for migraine. Repeat in 2 hrs if needed.), Disp: 20 tablet, Rfl: 0 .  valACYclovir (VALTREX) 1000 MG tablet, Take 1 tablet (1,000 mg total) by mouth 3 (three) times daily., Disp: 30 tablet, Rfl: 0  EXAM:  VITALS per patient if applicable:  GENERAL: alert, oriented, appears well and in no acute distress  HEENT: atraumatic, conjunttiva clear, no obvious abnormalities on inspection of external nose and ears  NECK:  normal movements of the head and neck  LUNGS: on inspection no signs of respiratory distress, breathing rate appears normal, no obvious gross SOB, gasping or wheezing  CV: no obvious cyanosis  MS: moves all visible extremities without noticeable abnormality  PSYCH/NEURO: pleasant and cooperative, no obvious depression or anxiety, speech and thought processing grossly intact  ASSESSMENT AND PLAN: This is likely to be shingles. Treat with 10 days of Valtrex. Recheck prn.  Alysia Penna, MD  Discussed the following assessment and plan:  No diagnosis found.     I discussed the assessment and treatment plan with the patient. The patient was provided an opportunity to ask questions  and all were answered. The patient agreed with the plan and demonstrated an understanding of the instructions.   The patient was advised to call back or seek an in-person evaluation if the symptoms worsen or if the condition fails to improve as anticipated.

## 2019-09-09 ENCOUNTER — Ambulatory Visit: Payer: No Typology Code available for payment source | Admitting: Orthopaedic Surgery

## 2020-01-26 ENCOUNTER — Other Ambulatory Visit: Payer: Self-pay

## 2020-01-27 ENCOUNTER — Encounter: Payer: Self-pay | Admitting: Family Medicine

## 2020-01-27 ENCOUNTER — Ambulatory Visit (INDEPENDENT_AMBULATORY_CARE_PROVIDER_SITE_OTHER): Payer: No Typology Code available for payment source | Admitting: Family Medicine

## 2020-01-27 VITALS — BP 130/70 | HR 113 | Temp 97.7°F | Wt 154.4 lb

## 2020-01-27 DIAGNOSIS — N211 Calculus in urethra: Secondary | ICD-10-CM

## 2020-01-27 DIAGNOSIS — S139XXA Sprain of joints and ligaments of unspecified parts of neck, initial encounter: Secondary | ICD-10-CM | POA: Diagnosis not present

## 2020-01-27 DIAGNOSIS — R300 Dysuria: Secondary | ICD-10-CM | POA: Diagnosis not present

## 2020-01-27 DIAGNOSIS — A630 Anogenital (venereal) warts: Secondary | ICD-10-CM

## 2020-01-27 LAB — POCT URINALYSIS DIPSTICK
Bilirubin, UA: NEGATIVE
Blood, UA: NEGATIVE
Glucose, UA: NEGATIVE
Ketones, UA: NEGATIVE
Leukocytes, UA: NEGATIVE
Nitrite, UA: NEGATIVE
Protein, UA: NEGATIVE
Spec Grav, UA: 1.01 (ref 1.010–1.025)
Urobilinogen, UA: 0.2 E.U./dL
pH, UA: 6 (ref 5.0–8.0)

## 2020-01-27 MED ORDER — IMIQUIMOD 5 % EX CREA: TOPICAL_CREAM | Freq: Every day | CUTANEOUS | 5 refills | Status: DC

## 2020-01-27 NOTE — Progress Notes (Signed)
   Subjective:    Patient ID: Ralph Lang, male    DOB: 1976/01/25, 44 y.o.   MRN: [id number]  HPI Here for 4 issues. First one week ago he had a mild left sided mid back pain that last a few hours and then went away. Then a few hours later he felt a sudden urgency to urinate and he had burning pain when he urinated. No blood or DC was seen. He began to drink lots of water and he took AZO. No fever or nausea. Then yesterday these symptoms went away and today he feels fine. He notes that he has had 2 kidney stones in the past. Second he has had 2 warts in the pubic are for years and he wants to treat them. Third about a week ago while climbing off a [place] his foot got caught and he fell to the ground, landing on the left shoulder. Since then he has had stiffness and pain in the left neck and above the left shoulder blade. The shoulder itself is fine. He has been applying ice, heat, and Icy Hot, and he is taking Ibuprofen.    Review of Systems  Constitutional: Negative.   Respiratory: Negative.   Cardiovascular: Negative.   Gastrointestinal: Negative.   Genitourinary: Positive for dysuria, frequency and urgency. Negative for discharge, flank pain and hematuria.  Musculoskeletal: Positive for arthralgias.       Objective:   Physical Exam Constitutional:      Appearance: Normal appearance. He is not ill-appearing.  Cardiovascular:     Rate and Rhythm: Normal rate and regular rhythm.     Pulses: Normal pulses.     Heart sounds: Normal heart sounds.  Pulmonary:     Effort: Pulmonary effort is normal.     Breath sounds: Normal breath sounds.  Abdominal:     General: Abdomen is flat. Bowel sounds are normal. There is no distension.     Palpations: Abdomen is soft. There is no mass.     Tenderness: There is no abdominal tenderness. There is no guarding or rebound.     Hernia: No hernia is present.  Genitourinary:    Penis: Normal.      Testes: Normal.     Comments: There are 2  large flat warts on the pubis  Musculoskeletal:     Comments: He is tender along the left posterior neck and in the left upper trapezius. The neck and shoulder have full ROM.   Neurological:     Mental Status: He is alert.           Assessment & Plan:  It seems likely that he had a urethral stone that has now passed. I reminded him to avoid drinking sodas and to drink lots of water daily. Second he will treat the warts with Imiquimod cream until they are gone. Third he has sprained his neck. This should resolve with time. He can continue with ice and Ibuprofen.  Alysia Penna, MD

## 2021-05-09 ENCOUNTER — Telehealth: Payer: Self-pay | Admitting: Orthopaedic Surgery

## 2021-05-09 ENCOUNTER — Ambulatory Visit (HOSPITAL_COMMUNITY)
Admission: EM | Admit: 2021-05-09 | Discharge: 2021-05-09 | Disposition: A | Discharge status: Home / Self Care | Payer: No Typology Code available for payment source | Attending: Physician Assistant | Admitting: Physician Assistant

## 2021-05-09 ENCOUNTER — Encounter (HOSPITAL_COMMUNITY): Payer: Self-pay | Admitting: Emergency Medicine

## 2021-05-09 ENCOUNTER — Other Ambulatory Visit: Payer: Self-pay

## 2021-05-09 DIAGNOSIS — M5442 Lumbago with sciatica, left side: Secondary | ICD-10-CM

## 2021-05-09 MED ORDER — METHYLPREDNISOLONE ACETATE 80 MG/ML IJ SUSP
INTRAMUSCULAR | Status: AC
  Filled 2021-05-09: qty 1

## 2021-05-09 MED ORDER — METHYLPREDNISOLONE ACETATE 40 MG/ML IJ SUSP: 60.0000 mg | Freq: Once | INTRAMUSCULAR | Status: DC

## 2021-05-09 MED ORDER — METHYLPREDNISOLONE ACETATE 80 MG/ML IJ SUSP
60.0000 mg | Freq: Once | INTRAMUSCULAR | Status: AC
  Administered 2021-05-09: 60 mg via INTRAMUSCULAR

## 2021-05-09 MED ORDER — METHOCARBAMOL 500 MG PO TABS: 500.0000 mg | ORAL_TABLET | Freq: Three times a day (TID) | ORAL | 0 refills | Status: DC | PRN

## 2021-05-09 NOTE — Discharge Instructions (Signed)
We gave you a steroid injection today.  I have also called in a muscle relaxer known as methocarbamol.  You can take this up to 3 times a day as needed but this will make you sleepy do not drive or drink alcohol while taking it.  You can use heat, rest, stretch for additional symptom relief.  I do recommend following up with your orthopedic provider as you may require physical therapy or other interventions to manage your pain.  If you have any worsening symptoms including increased pain, leg weakness, bowel/bladder incontinence, difficulty going to the bathroom you need to go to the emergency room as we discussed.

## 2021-05-09 NOTE — Telephone Encounter (Signed)
Patient's last ESI was in 2020. [place] for OV?

## 2021-05-09 NOTE — ED Provider Notes (Signed)
[place]    CSN: [id number] Arrival date & time: 05/09/21  1400      History   Chief Complaint Chief Complaint  Patient presents with   Back Pain    Lower    HPI Ralph Lang is a 45 y.o. male.   Patient presents today with a 1 day history of left-sided lower back pain.  He has a history of chronic lower back pain with known degenerative disc disease and herniated disks.  He was followed by orthopedics and was given interfacet injections which adequately manage pain until recently.  Reports that yesterday when he was working he stepped off of a trailer incorrectly which then caused recurrence of pain.  At rest pain is rated 4 on a 0-10 pain scale but increases to 7 with twisting or certain movements, localized to left lumbar back with radiation throughout posterior left leg and into foot, described as tension with periodic shooting pains, no relieving factors identified.  He has not tried any over-the-counter medications for symptom management.  He denies any previous spinal surgery or injury.  Denies history of malignancy.  He denies any bowel/bladder incontinence, lower extremity weakness, saddle anesthesia, urinary retention.   Past Medical History:  Diagnosis Date   Anxiety    GERD (gastroesophageal reflux disease)    Kidney stone    Migraine    Panic attack     Patient Active Problem List   Diagnosis Date Noted   Medial epicondylitis of elbow, left 04/12/2019   Protrusion of lumbar intervertebral disc 04/12/2019   Genital warts 07/02/2018   Migraine 07/02/2018   Midepigastric pain 06/14/2018   Unintentional weight loss 06/14/2018   Early satiety 06/14/2018   Food aversion 06/14/2018   Generalized anxiety disorder 07/26/2013   Smoker 07/26/2013    Past Surgical History:  Procedure Laterality Date   APPENDECTOMY     COLONOSCOPY WITH ESOPHAGOGASTRODUODENOSCOPY (EGD)  06/30/2018   CYST REMOVAL HAND     CYST REMOVAL NECK     LACERATION REPAIR  Right    wrist       Home Medications    Prior to Admission medications   Medication Sig Start Date End Date Taking? Authorizing Provider  alprazolam Duanne Moron) 2 MG tablet Take 2 mg by mouth 2 (two) times daily.     [provider]  ALPRAZOLAM XR 0.5 MG 24 hr tablet Take 0.5 mg by mouth 2 (two) times daily. 10/07/16   [provider]  Aspirin-Acetaminophen-Caffeine (EXCEDRIN PO) Take 3 tablets by mouth daily as needed (headache/migraine).     [provider]  imiquimod (ALDARA) 5 % cream Apply topically at bedtime. 01/27/20   Laurey Morale, MD  methocarbamol (ROBAXIN) 500 MG tablet Take 1 tablet (500 mg total) by mouth every 8 (eight) hours as needed for muscle spasms. 05/09/21   Albirtha Grinage, Derry Skill, PA-C  omeprazole (PRILOSEC) 40 MG capsule Take 1 capsule (40 mg total) by mouth 2 (two) times daily. 06/10/18   Mansouraty, Telford Nab., MD  predniSONE (DELTASONE) 10 MG tablet Take 2 daily with food for 3 days then one tablet daiy 04/12/19   Marybelle Killings, MD  SUMAtriptan (IMITREX) 100 MG tablet Take 1 tab every 2 hrs for migraine. Repeat in 2 hrs if needed. PATIENT NEEDS OFFICE VISIT FOR ADDITIONAL REFILLS Patient taking differently: Take 1/2 tab every 2 hrs for migraine. Repeat in 2 hrs if needed. 05/17/15   Le, Thao P, DO  valACYclovir (VALTREX) 1000 MG tablet Take  1 tablet (1,000 mg total) by mouth 3 (three) times daily. 07/13/19   Laurey Morale, MD    Family History Family History  Problem Relation Age of Onset   Hypertension Mother    Migraines Mother    Hypertension Father    Migraines Brother    Migraines Maternal Grandmother    Alzheimer's disease Maternal Grandmother    Colon cancer Paternal Uncle    Esophageal cancer Neg Hx    Inflammatory bowel disease Neg Hx    Liver disease Neg Hx    Pancreatic cancer Neg Hx    Rectal cancer Neg Hx    Stomach cancer Neg Hx     Social History Social History   Tobacco Use   Smoking status: Every Day    Types:  Cigarettes   Smokeless tobacco: Never  Vaping Use   Vaping Use: Former  Substance Use Topics   Alcohol use: Yes    Comment: once a month   Drug use: Yes    Types: Marijuana    Comment: last had 3-4 weeks ago     Allergies   Patient has no known allergies.   Review of Systems Review of Systems  Constitutional:  Positive for activity change. Negative for appetite change, fatigue and fever.  Respiratory:  Negative for cough and shortness of breath.   Cardiovascular:  Negative for chest pain.  Gastrointestinal:  Negative for abdominal pain, diarrhea, nausea and vomiting.  Musculoskeletal:  Positive for back pain. Negative for arthralgias and myalgias.  Neurological:  Negative for dizziness, weakness, light-headedness, numbness and headaches.    Physical Exam Triage Vital Signs ED Triage Vitals  Enc Vitals Group     BP 05/09/21 1529 125/83     Pulse Rate 05/09/21 1529 86     Resp 05/09/21 1529 18     Temp 05/09/21 1529 98.6 F (37 C)     Temp Source 05/09/21 1529 Oral     SpO2 05/09/21 1529 96 %     Weight --      Height --      Head Circumference --      Peak Flow --      Pain Score 05/09/21 1528 7     Pain Loc --      Pain Edu? --      Excl. in [place]? --    No data found.  Updated Vital Signs BP 125/83 (BP Location: Right Arm)   Pulse 86   Temp 98.6 F (37 C) (Oral)   Resp 18   SpO2 96%   Visual Acuity Right Eye Distance:   Left Eye Distance:   Bilateral Distance:    Right Eye Near:   Left Eye Near:    Bilateral Near:     Physical Exam Vitals reviewed.  Constitutional:      General: He is awake.     Appearance: Normal appearance. He is normal weight. He is not ill-appearing.     Comments: Very pleasant male appears stated age obviously uncomfortable but in no acute distress sitting on exam room table  HENT:     Head: Normocephalic and atraumatic.  Cardiovascular:     Rate and Rhythm: Normal rate and regular rhythm.     Heart sounds: Normal heart  sounds, S1 normal and S2 normal. No murmur heard. Pulmonary:     Effort: Pulmonary effort is normal.     Breath sounds: Normal breath sounds. No stridor. No wheezing, rhonchi or rales.  Comments: Clear to auscultation bilaterally Musculoskeletal:     Cervical back: No tenderness or bony tenderness.     Thoracic back: No tenderness or bony tenderness.     Lumbar back: Tenderness present. No spasms or bony tenderness.     Comments: Significant tenderness palpation of left lumbar paraspinal muscles.  Normal gait.  Strength 5/5 bilateral lower extremities.  No pain percussion of vertebrae.  Neurological:     Mental Status: He is alert.  Psychiatric:        Behavior: Behavior is cooperative.     UC Treatments / Results  Labs (all labs ordered are listed, but only abnormal results are displayed) Labs Reviewed - No data to display  EKG   Radiology No results found.  Procedures Procedures (including critical care time)  Medications Ordered in UC Medications  methylPREDNISolone acetate (DEPO-MEDROL) injection 60 mg (has no administration in time range)    Initial Impression / Assessment and Plan / UC Course  I have reviewed the triage vital signs and the nursing notes.  Pertinent labs & imaging results that were available during my care of the patient were reviewed by me and considered in my medical decision making (see chart for details).      No alarm symptoms that warrant emergent imaging.  No indication for repeat x-ray as patient has known degenerative disc disease and denies any recent trauma and has no bony tenderness on exam.  Discussed that we are unable to perform interfacet injections but will give him IM injection of Depo-Medrol in the hopes this would provide some relief of symptoms.  He was prescribed methocarbamol to be used up to 3 times a day with instruction not to drive drink alcohol taking this medication.  Encouraged him to rest and use heat/stretch for  additional symptom relief.  Encouraged him to follow-up with orthopedics for ongoing management of this condition.  We discussed at length alarm symptoms including signs of cauda equina syndrome that warrant going to the emergency room.  Strict return precautions given to which she expressed understanding.  Final Clinical Impressions(s) / UC Diagnoses   Final diagnoses:  Acute left-sided low back pain with left-sided sciatica     Discharge Instructions      We gave you a steroid injection today.  I have also called in a muscle relaxer known as methocarbamol.  You can take this up to 3 times a day as needed but this will make you sleepy do not drive or drink alcohol while taking it.  You can use heat, rest, stretch for additional symptom relief.  I do recommend following up with your orthopedic provider as you may require physical therapy or other interventions to manage your pain.  If you have any worsening symptoms including increased pain, leg weakness, bowel/bladder incontinence, difficulty going to the bathroom you need to go to the emergency room as we discussed.     ED Prescriptions     Medication Sig Dispense Auth. Provider   methocarbamol (ROBAXIN) 500 MG tablet Take 1 tablet (500 mg total) by mouth every 8 (eight) hours as needed for muscle spasms. 30 tablet Serenna Deroy, Derry Skill, PA-C      PDMP not reviewed this encounter.   Terrilee Croak, PA-C 05/09/21 1554

## 2021-05-09 NOTE — Telephone Encounter (Signed)
Patient walked into office requesting an appointment to see Dr Ernestina Patches for a back injection.

## 2021-05-09 NOTE — ED Triage Notes (Signed)
Pt presents with lower back pain that radiates down into left. States slipped off trailer yesterday.

## 2021-05-10 NOTE — Telephone Encounter (Signed)
Left message #1

## 2021-05-13 ENCOUNTER — Encounter: Payer: Self-pay | Admitting: Physical Medicine and Rehabilitation

## 2021-05-13 ENCOUNTER — Telehealth: Payer: Self-pay | Admitting: Physical Medicine and Rehabilitation

## 2021-05-13 ENCOUNTER — Ambulatory Visit: Payer: No Typology Code available for payment source | Admitting: Physical Medicine and Rehabilitation

## 2021-05-13 ENCOUNTER — Other Ambulatory Visit: Payer: Self-pay

## 2021-05-13 VITALS — BP 122/80 | HR 98

## 2021-05-13 DIAGNOSIS — M4726 Other spondylosis with radiculopathy, lumbar region: Secondary | ICD-10-CM

## 2021-05-13 DIAGNOSIS — M47816 Spondylosis without myelopathy or radiculopathy, lumbar region: Secondary | ICD-10-CM

## 2021-05-13 DIAGNOSIS — M519 Unspecified thoracic, thoracolumbar and lumbosacral intervertebral disc disorder: Secondary | ICD-10-CM | POA: Diagnosis not present

## 2021-05-13 DIAGNOSIS — M5416 Radiculopathy, lumbar region: Secondary | ICD-10-CM | POA: Diagnosis not present

## 2021-05-13 NOTE — Progress Notes (Signed)
Pt state lower back pain that travels down his left leg. Pt state walking, standing and sitting makes the pain worse. Pt state he takes pain meds and heating to help ease his pain.  Numeric Pain Rating Scale and Functional Assessment Average Pain 8 Pain Right Now 8 My pain is intermittent, constant, sharp, and stabbing Pain is worse with: walking, bending, standing, and some activites Pain improves with: heat/ice and medication   In the last MONTH (on 0-10 scale) has pain interfered with the following?  1. General activity like being  able to carry out your everyday physical activities such as walking, climbing stairs, carrying groceries, or moving a chair?  Rating(7)  2. Relation with others like being able to carry out your usual social activities and roles such as  activities at home, at work and in your community. Rating(8)  3. Enjoyment of life such that you have  been bothered by emotional problems such as feeling anxious, depressed or irritable?  Rating(9)

## 2021-05-13 NOTE — Telephone Encounter (Signed)
Pt called and he threw his back out. Wondering if he could get in to see Dr.Newton.   CB [phone]

## 2021-05-13 NOTE — Progress Notes (Signed)
Ralph Lang - 45 y.o. male MRN [id number]  Date of birth: [birth year]  Office Visit Note: Visit Date: 05/13/2021 PCP: Caren Macadam, MD Referred by: Caren Macadam, MD  Subjective: Chief Complaint  Patient presents with   Lower Back - Pain   Left Leg - Pain   HPI: Ralph Lang is a 45 y.o. male who comes in today For evaluation of chronic left lower back pain radiating to lateral leg and down to ankle.  Patient reports pain has been chronic for several years and has recently started bothering him again within the last several months.  Patient states his pain increased significantly after slipping off of a trailer several weeks ago.  Patient states pain is exacerbated by activity and lifting, describes as sore and shooting sensation, currently rates as 9 out of 10.  Patient reports some relief of pain with rest and home exercise program.  Patient was recently seen at [facility] in [place] for left-sided lower back pain and was given IM depo-medrol and prescribed Robaxin, which is not helping to alleviate his pain. Patient's lumbar MRI from 2020 exhibits disc protrusions at L4-5 and L5-S1, also mild facet disease, left greater than right at L5-S1. Patient had left L5-S1 interlaminar epidural steroid injection in 2020 with significant and sustained relief until the last several months. Patient states his pain is unbearable and nothing seems to help at the moment, he reports that he is having trouble completing tasks at work due to increased pain. Patient denies focal weakness, numbness and tingling.     Review of Systems  Musculoskeletal:  Positive for back pain.  Neurological:  Negative for tingling, sensory change, focal weakness and weakness.  All other systems reviewed and are negative. Otherwise per HPI.  Assessment & Plan: Visit Diagnoses:    ICD-10-CM   1. Lumbar radiculopathy  M54.16 Ambulatory referral to Physical Medicine Rehab    2.  Intervertebral lumbar disc disorder  M51.9     3. Facet arthropathy, lumbar  M47.816     4. Other spondylosis with radiculopathy, lumbar region  M47.26        Plan: Findings:  Chronic, worsening and severe left sided lower back pain radiating to buttock, lateral leg and down to ankle. Good and sustained relief from previous left L5-S1 interlaminar epidural steroid injection until the past several months. We believe this is an acute flare up of a chronic condition. Patient's clinical presentation and exam are consistent with classic L5 nerve pattern. We believe the next step is to repeat left L5-S1 interlaminar epidural steroid injection. Patient encouraged to continue home exercise program as tolerated. Also instructed him to take Tylenol 500 mg by mouth 4 times a days for pain management at home. We feel confident that we can get patient in quickly for injection, within the next week. No red flag symptoms noted upon exam today.    Meds & Orders: No orders of the defined types were placed in this encounter.   Orders Placed This Encounter  Procedures   Ambulatory referral to Physical Medicine Rehab    Follow-up: Return in about 1 week (around 05/20/2021) for Left L5-S1 interlaminar epidural steroid injection.   Procedures: No procedures performed      Clinical History: EXAM: MRI LUMBAR SPINE WITHOUT CONTRAST   TECHNIQUE: Multiplanar, multisequence MR imaging of the lumbar spine was performed. No intravenous contrast was administered.   COMPARISON:  11/03/2010 MRI   FINDINGS: Segmentation: There are five lumbar  type vertebral bodies. The last full intervertebral disc space is labeled L5-S1. This correlates with the prior MRI.   Alignment: Very slight anterolisthesis of L5. Unilateral pars defect noted on the right. The other lumbar vertebral bodies are normally aligned.   Vertebrae: Normal marrow signal. No bone lesions or fractures. Mild endplate reactive changes at T12-L1 and  L5-S1.   Conus medullaris and cauda equina: Conus extends to the L1 level. Conus and cauda equina appear normal.   Paraspinal and other soft tissues: No significant paraspinal or retroperitoneal findings.   Disc levels:   T12-L1: No significant findings.   L1-2: No significant findings.   L2-3: No significant findings.   L3-4: No significant findings.   L4-5: Slight bulging uncovered disc along with a shallow residual disc protrusion but no neural compression. The canal is fairly generous. No lateral recess or foraminal stenosis.   L5-S1: Slightly progressive degenerative disc disease at this level with disc desiccation, disc space narrowing and endplate reactive changes. The sizable disc protrusion seen on the prior study demonstrates desiccation and retraction. There is a shallow residual central and slightly left paracentral disc protrusion but the spinal canal is fairly generous in is no significant spinal, lateral recess or foraminal stenosis. Mild facet disease, left greater than right.   IMPRESSION: 1. Interval desiccation and retraction of disc protrusions at L4-5 and L5-S1. Residual shallow disc protrusions but no significant neural compression. 2. The other intervertebral disc spaces are unremarkable and stable. 3. Unilateral pars defect on the right at L5 with a very slight anterolisthesis. 4. Progressive degenerative disc disease at L5-S1.     Electronically Signed   By: Marijo Sanes M.D.   On: 05/05/2019 14:47   He reports that he has been smoking cigarettes. He has never used smokeless tobacco. No results for input(s): HGBA1C, LABURIC in the last 8760 hours.  Objective:  VS:  HT:    WT:   BMI:     BP:122/80  HR:98bpm  TEMP: ( )  RESP:  Physical Exam HENT:     Head: Normocephalic and atraumatic.     Right Ear: Tympanic membrane normal.     Left Ear: Tympanic membrane normal.     Nose: Nose normal.     Mouth/Throat:     Mouth: Mucous membranes  are moist.  Eyes:     Pupils: Pupils are equal, round, and reactive to light.  Cardiovascular:     Rate and Rhythm: Normal rate.     Pulses: Normal pulses.  Pulmonary:     Effort: Pulmonary effort is normal.  Abdominal:     General: Abdomen is flat. There is no distension.  Musculoskeletal:        General: Tenderness present.     Cervical back: Normal range of motion.     Comments: Pt is slow to rise from seated position to standing. Good lumbar range of motion. Strong distal strength without clonus, no pain upon palpation of greater trochanters. Sensation intact bilaterally. Dysesthesias noted to L5 dermatome. Walks independently, gait steady.    Skin:    General: Skin is warm and dry.     Capillary Refill: Capillary refill takes less than 2 seconds.  Neurological:     General: No focal deficit present.     Mental Status: He is alert.  Psychiatric:        Mood and Affect: Mood normal.    Ortho Exam  Imaging: No results found.  Past Medical/Family/Surgical/Social History: Medications & Allergies reviewed  per EMR, new medications updated. Patient Active Problem List   Diagnosis Date Noted   Medial epicondylitis of elbow, left 04/12/2019   Protrusion of lumbar intervertebral disc 04/12/2019   Genital warts 07/02/2018   Migraine 07/02/2018   Midepigastric pain 06/14/2018   Unintentional weight loss 06/14/2018   Early satiety 06/14/2018   Food aversion 06/14/2018   Generalized anxiety disorder 07/26/2013   Smoker 07/26/2013   Past Medical History:  Diagnosis Date   Anxiety    GERD (gastroesophageal reflux disease)    Kidney stone    Migraine    Panic attack    Family History  Problem Relation Age of Onset   Hypertension Mother    Migraines Mother    Hypertension Father    Migraines Brother    Migraines Maternal Grandmother    Alzheimer's disease Maternal Grandmother    Colon cancer Paternal Uncle    Esophageal cancer Neg Hx    Inflammatory bowel disease Neg  Hx    Liver disease Neg Hx    Pancreatic cancer Neg Hx    Rectal cancer Neg Hx    Stomach cancer Neg Hx    Past Surgical History:  Procedure Laterality Date   APPENDECTOMY     COLONOSCOPY WITH ESOPHAGOGASTRODUODENOSCOPY (EGD)  06/30/2018   CYST REMOVAL HAND     CYST REMOVAL NECK     LACERATION REPAIR Right    wrist   Social History   Occupational History   Occupation: Engineer, building services  Tobacco Use   Smoking status: Every Day    Types: Cigarettes   Smokeless tobacco: Never  Vaping Use   Vaping Use: Former  Substance and Sexual Activity   Alcohol use: Yes    Comment: once a month   Drug use: Yes    Types: Marijuana    Comment: last had 3-4 weeks ago   Sexual activity: Yes    Partners: Female

## 2021-05-13 NOTE — Telephone Encounter (Signed)
See previous message

## 2021-05-13 NOTE — Telephone Encounter (Signed)
Scheduled for OV today at 1500.

## 2021-05-16 ENCOUNTER — Ambulatory Visit: Payer: Self-pay

## 2021-05-16 ENCOUNTER — Other Ambulatory Visit: Payer: Self-pay

## 2021-05-16 ENCOUNTER — Ambulatory Visit (INDEPENDENT_AMBULATORY_CARE_PROVIDER_SITE_OTHER): Payer: No Typology Code available for payment source | Admitting: Physical Medicine and Rehabilitation

## 2021-05-16 ENCOUNTER — Encounter: Payer: Self-pay | Admitting: Physical Medicine and Rehabilitation

## 2021-05-16 VITALS — BP 145/82 | HR 96

## 2021-05-16 DIAGNOSIS — M5416 Radiculopathy, lumbar region: Secondary | ICD-10-CM

## 2021-05-16 MED ORDER — BETAMETHASONE SOD PHOS & ACET 6 (3-3) MG/ML IJ SUSP
12.0000 mg | Freq: Once | INTRAMUSCULAR | Status: AC
  Administered 2021-05-16: 12 mg

## 2021-05-16 NOTE — Progress Notes (Signed)
Pt state lower back pain that travels down his left leg. Pt state walking, standing and sitting makes the pain worse. Pt state he takes pain meds and heating to help ease his pain.  Numeric Pain Rating Scale and Functional Assessment Average Pain 8   In the last MONTH (on 0-10 scale) has pain interfered with the following?  1. General activity like being  able to carry out your everyday physical activities such as walking, climbing stairs, carrying groceries, or moving a chair?  Rating(8)   +Driver, -BT, -Dye Allergies.

## 2021-05-16 NOTE — Patient Instructions (Signed)

## 2021-05-23 NOTE — Progress Notes (Signed)
I haven't seen this patient since 2019 when he established care. Needs to have office visit if he is still getting primary care through our office.

## 2021-05-24 NOTE — Procedures (Signed)
Lumbar Epidural Steroid Injection - Interlaminar Approach with Fluoroscopic Guidance  Patient: Ralph Lang      Date of Birth: [birth year] MRN: [id number] PCP: Caren Macadam, MD      Visit Date: 05/16/2021   Universal Protocol:     Consent Given By: the patient  Position: PRONE  Additional Comments: Vital signs were monitored before and after the procedure. Patient was prepped and draped in the usual sterile fashion. The correct patient, procedure, and site was verified.   Injection Procedure Details:   Procedure diagnoses: Lumbar radiculopathy [M54.16]   Meds Administered:  Meds ordered this encounter  Medications   betamethasone acetate-betamethasone sodium phosphate (CELESTONE) injection 12 mg     Laterality: Left  Location/Site:  L5-S1  Needle: 3.5 in., 20 ga. Tuohy  Needle Placement: Paramedian epidural  Findings:   -Comments: Excellent flow of contrast into the epidural space.  Procedure Details: Using a paramedian approach from the side mentioned above, the region overlying the inferior lamina was localized under fluoroscopic visualization and the soft tissues overlying this structure were infiltrated with 4 ml. of 1% Lidocaine without Epinephrine. The Tuohy needle was inserted into the epidural space using a paramedian approach.   The epidural space was localized using loss of resistance along with counter oblique bi-planar fluoroscopic views.  After negative aspirate for air, blood, and CSF, a 2 ml. volume of Isovue-250 was injected into the epidural space and the flow of contrast was observed. Radiographs were obtained for documentation purposes.    The injectate was administered into the level noted above.   Additional Comments:  The patient tolerated the procedure well Dressing: 2 x 2 sterile gauze and Band-Aid    Post-procedure details: Patient was observed during the procedure. Post-procedure instructions were reviewed.  Patient left  the clinic in stable condition.

## 2021-05-24 NOTE — Progress Notes (Signed)
Corbett Moulder - 45 y.o. male MRN [id number]  Date of birth: [birth year]  Office Visit Note: Visit Date: 05/16/2021 PCP: Caren Macadam, MD Referred by: Caren Macadam, MD  Subjective: Chief Complaint  Patient presents with   Lower Back - Pain   Left Leg - Pain   HPI:  Jawon Dipiero is a 45 y.o. male who comes in today for planned Left L5-S1 Lumbar Interlaminar epidural steroid injection with fluoroscopic guidance.  The patient has failed conservative care including home exercise, medications, time and activity modification.  This injection will be diagnostic and hopefully therapeutic.  Please see requesting physician notes for further details and justification.   ROS Otherwise per HPI.  Assessment & Plan: Visit Diagnoses:    ICD-10-CM   1. Lumbar radiculopathy  M54.16 XR C-ARM NO REPORT    Epidural Steroid injection    betamethasone acetate-betamethasone sodium phosphate (CELESTONE) injection 12 mg      Plan: No additional findings.   Meds & Orders:  Meds ordered this encounter  Medications   betamethasone acetate-betamethasone sodium phosphate (CELESTONE) injection 12 mg    Orders Placed This Encounter  Procedures   XR C-ARM NO REPORT   Epidural Steroid injection    Follow-up: No follow-ups on file.   Procedures: No procedures performed  Lumbar Epidural Steroid Injection - Interlaminar Approach with Fluoroscopic Guidance  Patient: Macari Zalesky      Date of Birth: 29-Sep-1975 MRN: [id number] PCP: Caren Macadam, MD      Visit Date: 05/16/2021   Universal Protocol:     Consent Given By: the patient  Position: PRONE  Additional Comments: Vital signs were monitored before and after the procedure. Patient was prepped and draped in the usual sterile fashion. The correct patient, procedure, and site was verified.   Injection Procedure Details:   Procedure diagnoses: Lumbar radiculopathy [M54.16]   Meds Administered:  Meds  ordered this encounter  Medications   betamethasone acetate-betamethasone sodium phosphate (CELESTONE) injection 12 mg     Laterality: Left  Location/Site:  L5-S1  Needle: 3.5 in., 20 ga. Tuohy  Needle Placement: Paramedian epidural  Findings:   -Comments: Excellent flow of contrast into the epidural space.  Procedure Details: Using a paramedian approach from the side mentioned above, the region overlying the inferior lamina was localized under fluoroscopic visualization and the soft tissues overlying this structure were infiltrated with 4 ml. of 1% Lidocaine without Epinephrine. The Tuohy needle was inserted into the epidural space using a paramedian approach.   The epidural space was localized using loss of resistance along with counter oblique bi-planar fluoroscopic views.  After negative aspirate for air, blood, and CSF, a 2 ml. volume of Isovue-250 was injected into the epidural space and the flow of contrast was observed. Radiographs were obtained for documentation purposes.    The injectate was administered into the level noted above.   Additional Comments:  The patient tolerated the procedure well Dressing: 2 x 2 sterile gauze and Band-Aid    Post-procedure details: Patient was observed during the procedure. Post-procedure instructions were reviewed.  Patient left the clinic in stable condition.    Clinical History: EXAM: MRI LUMBAR SPINE WITHOUT CONTRAST   TECHNIQUE: Multiplanar, multisequence MR imaging of the lumbar spine was performed. No intravenous contrast was administered.   COMPARISON:  11/03/2010 MRI   FINDINGS: Segmentation: There are five lumbar type vertebral bodies. The last full intervertebral disc space is labeled L5-S1. This correlates with the prior MRI.  Alignment: Very slight anterolisthesis of L5. Unilateral pars defect noted on the right. The other lumbar vertebral bodies are normally aligned.   Vertebrae: Normal marrow signal. No  bone lesions or fractures. Mild endplate reactive changes at T12-L1 and L5-S1.   Conus medullaris and cauda equina: Conus extends to the L1 level. Conus and cauda equina appear normal.   Paraspinal and other soft tissues: No significant paraspinal or retroperitoneal findings.   Disc levels:   T12-L1: No significant findings.   L1-2: No significant findings.   L2-3: No significant findings.   L3-4: No significant findings.   L4-5: Slight bulging uncovered disc along with a shallow residual disc protrusion but no neural compression. The canal is fairly generous. No lateral recess or foraminal stenosis.   L5-S1: Slightly progressive degenerative disc disease at this level with disc desiccation, disc space narrowing and endplate reactive changes. The sizable disc protrusion seen on the prior study demonstrates desiccation and retraction. There is a shallow residual central and slightly left paracentral disc protrusion but the spinal canal is fairly generous in is no significant spinal, lateral recess or foraminal stenosis. Mild facet disease, left greater than right.   IMPRESSION: 1. Interval desiccation and retraction of disc protrusions at L4-5 and L5-S1. Residual shallow disc protrusions but no significant neural compression. 2. The other intervertebral disc spaces are unremarkable and stable. 3. Unilateral pars defect on the right at L5 with a very slight anterolisthesis. 4. Progressive degenerative disc disease at L5-S1.     Electronically Signed   By: Marijo Sanes M.D.   On: 05/05/2019 14:47     Objective:  VS:  HT:    WT:   BMI:     BP:(!) 145/82  HR:96bpm  TEMP: ( )  RESP:  Physical Exam Vitals and nursing note reviewed.  Constitutional:      General: He is not in acute distress.    Appearance: Normal appearance. He is not ill-appearing.  HENT:     Head: Normocephalic and atraumatic.     Right Ear: External ear normal.     Left Ear: External ear  normal.     Nose: No congestion.  Eyes:     Extraocular Movements: Extraocular movements intact.  Cardiovascular:     Rate and Rhythm: Normal rate.     Pulses: Normal pulses.  Pulmonary:     Effort: Pulmonary effort is normal. No respiratory distress.  Abdominal:     General: There is no distension.     Palpations: Abdomen is soft.  Musculoskeletal:        General: No tenderness or signs of injury.     Cervical back: Neck supple.     Right lower leg: No edema.     Left lower leg: No edema.     Comments: Patient has good distal strength without clonus.  Skin:    Findings: No erythema or rash.  Neurological:     General: No focal deficit present.     Mental Status: He is alert and oriented to person, place, and time.     Sensory: No sensory deficit.     Motor: No weakness or abnormal muscle tone.     Coordination: Coordination normal.  Psychiatric:        Mood and Affect: Mood normal.        Behavior: Behavior normal.     Imaging: No results found.

## 2021-05-27 ENCOUNTER — Telehealth: Payer: Self-pay | Admitting: Physical Medicine and Rehabilitation

## 2021-05-27 ENCOUNTER — Telehealth: Payer: Self-pay | Admitting: *Deleted

## 2021-05-27 DIAGNOSIS — M5416 Radiculopathy, lumbar region: Secondary | ICD-10-CM

## 2021-05-27 NOTE — Telephone Encounter (Signed)
-----   Message from Caren Macadam, MD sent at 05/23/2021  6:37 PM EDT -----    ----- Message ----- From: Magnus Sinning, MD Sent: 05/14/2021   8:26 AM EDT To: Caren Macadam, MD

## 2021-05-27 NOTE — Telephone Encounter (Signed)
Patient called. He would like an appointment with Dr. Ernestina Patches. His call back number is [phone]

## 2021-05-27 NOTE — Telephone Encounter (Signed)
Spoke with the patient and informed him of the message below.  Patient denies having another PCP and a follow up appointment was scheduled for 10/21.

## 2021-05-27 NOTE — Telephone Encounter (Signed)
Left L5-S1 il on 9/22. Patient reports good relief of leg pain but has continued low back pain. Please advise.

## 2021-06-05 ENCOUNTER — Telehealth: Payer: Self-pay | Admitting: Physical Medicine and Rehabilitation

## 2021-06-05 NOTE — Telephone Encounter (Signed)
Pt returned call to [place]. Please call pt at [phone].

## 2021-06-06 NOTE — Telephone Encounter (Signed)
I did not call this patient.

## 2021-06-10 ENCOUNTER — Ambulatory Visit (INDEPENDENT_AMBULATORY_CARE_PROVIDER_SITE_OTHER): Payer: No Typology Code available for payment source | Admitting: Physical Medicine and Rehabilitation

## 2021-06-10 ENCOUNTER — Other Ambulatory Visit: Payer: Self-pay

## 2021-06-10 ENCOUNTER — Ambulatory Visit: Payer: Self-pay

## 2021-06-10 ENCOUNTER — Encounter: Payer: Self-pay | Admitting: Physical Medicine and Rehabilitation

## 2021-06-10 VITALS — BP 124/81 | HR 81

## 2021-06-10 DIAGNOSIS — M5416 Radiculopathy, lumbar region: Secondary | ICD-10-CM | POA: Diagnosis not present

## 2021-06-10 DIAGNOSIS — M519 Unspecified thoracic, thoracolumbar and lumbosacral intervertebral disc disorder: Secondary | ICD-10-CM | POA: Diagnosis not present

## 2021-06-10 MED ORDER — BETAMETHASONE SOD PHOS & ACET 6 (3-3) MG/ML IJ SUSP
12.0000 mg | Freq: Once | INTRAMUSCULAR | Status: AC
  Administered 2021-06-10: 12 mg

## 2021-06-10 NOTE — Progress Notes (Signed)
Pt state lower back that travels to his left side but pt also complaints about his right side. Pt state bending, sitting and getting out of the bed makes the pain worse. Pt state he takes over the counter pain meds and uses heat to help ease his pain. Pt has hx of inj on 05/16/21 pt state it helped and the pinch pain is gone but he also his pain on his right side now.  Numeric Pain Rating Scale and Functional Assessment Average Pain 4   In the last MONTH (on 0-10 scale) has pain interfered with the following?  1. General activity like being  able to carry out your everyday physical activities such as walking, climbing stairs, carrying groceries, or moving a chair?  Rating(8)   +Driver, -BT, -Dye Allergies.

## 2021-06-10 NOTE — Patient Instructions (Signed)

## 2021-06-14 ENCOUNTER — Ambulatory Visit: Payer: No Typology Code available for payment source | Admitting: Family Medicine

## 2021-06-24 NOTE — Progress Notes (Signed)
Ralph Lang - 45 y.o. male MRN [id number]  Date of birth: [birth year]  Office Visit Note: Visit Date: 06/10/2021 PCP: Caren Macadam, MD Referred by: Caren Macadam, MD  Subjective: Chief Complaint  Patient presents with   Lower Back - Pain   HPI:  Ralph Lang is a 45 y.o. male who comes in today at the request of Barnet Pall, FNP for planned Right L5-S1 Lumbar Interlaminar epidural steroid injection with fluoroscopic guidance.  The patient has failed conservative care including home exercise, medications, time and activity modification.  This injection will be diagnostic and hopefully therapeutic.  Please see requesting physician notes for further details and justification.   ROS Otherwise per HPI.  Assessment & Plan: Visit Diagnoses:    ICD-10-CM   1. Lumbar radiculopathy  M54.16 XR C-ARM NO REPORT    Epidural Steroid injection    betamethasone acetate-betamethasone sodium phosphate (CELESTONE) injection 12 mg    2. Intervertebral lumbar disc disorder  M51.9 XR C-ARM NO REPORT    Epidural Steroid injection    betamethasone acetate-betamethasone sodium phosphate (CELESTONE) injection 12 mg      Plan: No additional findings.   Meds & Orders:  Meds ordered this encounter  Medications   betamethasone acetate-betamethasone sodium phosphate (CELESTONE) injection 12 mg    Orders Placed This Encounter  Procedures   XR C-ARM NO REPORT   Epidural Steroid injection    Follow-up: Return for visit to requesting physician as needed.   Procedures: No procedures performed  Lumbar Epidural Steroid Injection - Interlaminar Approach with Fluoroscopic Guidance  Patient: Ralph Lang      Date of Birth: [birth year] MRN: [id number] PCP: Caren Macadam, MD      Visit Date: 06/10/2021   Universal Protocol:     Consent Given By: the patient  Position: PRONE  Additional Comments: Vital signs were monitored before and after the procedure. Patient  was prepped and draped in the usual sterile fashion. The correct patient, procedure, and site was verified.   Injection Procedure Details:   Procedure diagnoses: Lumbar radiculopathy [M54.16]   Meds Administered:  Meds ordered this encounter  Medications   betamethasone acetate-betamethasone sodium phosphate (CELESTONE) injection 12 mg     Laterality: Right  Location/Site:  L5-S1  Needle: 3.5 in., 20 ga. Tuohy  Needle Placement: Paramedian epidural  Findings:   -Comments: Excellent flow of contrast into the epidural space.  Procedure Details: Using a paramedian approach from the side mentioned above, the region overlying the inferior lamina was localized under fluoroscopic visualization and the soft tissues overlying this structure were infiltrated with 4 ml. of 1% Lidocaine without Epinephrine. The Tuohy needle was inserted into the epidural space using a paramedian approach.   The epidural space was localized using loss of resistance along with counter oblique bi-planar fluoroscopic views.  After negative aspirate for air, blood, and CSF, a 2 ml. volume of Isovue-250 was injected into the epidural space and the flow of contrast was observed. Radiographs were obtained for documentation purposes.    The injectate was administered into the level noted above.   Additional Comments:  No complications occurred Dressing: 2 x 2 sterile gauze and Band-Aid    Post-procedure details: Patient was observed during the procedure. Post-procedure instructions were reviewed.  Patient left the clinic in stable condition.   Clinical History: EXAM: MRI LUMBAR SPINE WITHOUT CONTRAST   TECHNIQUE: Multiplanar, multisequence MR imaging of the lumbar spine was performed. No intravenous contrast was administered.  COMPARISON:  11/03/2010 MRI   FINDINGS: Segmentation: There are five lumbar type vertebral bodies. The last full intervertebral disc space is labeled L5-S1. This  correlates with the prior MRI.   Alignment: Very slight anterolisthesis of L5. Unilateral pars defect noted on the right. The other lumbar vertebral bodies are normally aligned.   Vertebrae: Normal marrow signal. No bone lesions or fractures. Mild endplate reactive changes at T12-L1 and L5-S1.   Conus medullaris and cauda equina: Conus extends to the L1 level. Conus and cauda equina appear normal.   Paraspinal and other soft tissues: No significant paraspinal or retroperitoneal findings.   Disc levels:   T12-L1: No significant findings.   L1-2: No significant findings.   L2-3: No significant findings.   L3-4: No significant findings.   L4-5: Slight bulging uncovered disc along with a shallow residual disc protrusion but no neural compression. The canal is fairly generous. No lateral recess or foraminal stenosis.   L5-S1: Slightly progressive degenerative disc disease at this level with disc desiccation, disc space narrowing and endplate reactive changes. The sizable disc protrusion seen on the prior study demonstrates desiccation and retraction. There is a shallow residual central and slightly left paracentral disc protrusion but the spinal canal is fairly generous in is no significant spinal, lateral recess or foraminal stenosis. Mild facet disease, left greater than right.   IMPRESSION: 1. Interval desiccation and retraction of disc protrusions at L4-5 and L5-S1. Residual shallow disc protrusions but no significant neural compression. 2. The other intervertebral disc spaces are unremarkable and stable. 3. Unilateral pars defect on the right at L5 with a very slight anterolisthesis. 4. Progressive degenerative disc disease at L5-S1.     Electronically Signed   By: Marijo Sanes M.D.   On: 05/05/2019 14:47     Objective:  VS:  HT:    WT:   BMI:     BP:124/81  HR:81bpm  TEMP: ( )  RESP:  Physical Exam Vitals and nursing note reviewed.  Constitutional:       General: He is not in acute distress.    Appearance: Normal appearance. He is not ill-appearing.  HENT:     Head: Normocephalic and atraumatic.     Right Ear: External ear normal.     Left Ear: External ear normal.     Nose: No congestion.  Eyes:     Extraocular Movements: Extraocular movements intact.  Cardiovascular:     Rate and Rhythm: Normal rate.     Pulses: Normal pulses.  Pulmonary:     Effort: Pulmonary effort is normal. No respiratory distress.  Abdominal:     General: There is no distension.     Palpations: Abdomen is soft.  Musculoskeletal:        General: No tenderness or signs of injury.     Cervical back: Neck supple.     Right lower leg: No edema.     Left lower leg: No edema.     Comments: Patient has good distal strength without clonus.  Skin:    Findings: No erythema or rash.  Neurological:     General: No focal deficit present.     Mental Status: He is alert and oriented to person, place, and time.     Sensory: No sensory deficit.     Motor: No weakness or abnormal muscle tone.     Coordination: Coordination normal.  Psychiatric:        Mood and Affect: Mood normal.  Behavior: Behavior normal.     Imaging: No results found.

## 2021-06-24 NOTE — Procedures (Signed)
Lumbar Epidural Steroid Injection - Interlaminar Approach with Fluoroscopic Guidance  Patient: Ralph Lang      Date of Birth: 31-May-1976 MRN: [id number] PCP: Caren Macadam, MD      Visit Date: 06/10/2021   Universal Protocol:     Consent Given By: the patient  Position: PRONE  Additional Comments: Vital signs were monitored before and after the procedure. Patient was prepped and draped in the usual sterile fashion. The correct patient, procedure, and site was verified.   Injection Procedure Details:   Procedure diagnoses: Lumbar radiculopathy [M54.16]   Meds Administered:  Meds ordered this encounter  Medications   betamethasone acetate-betamethasone sodium phosphate (CELESTONE) injection 12 mg     Laterality: Right  Location/Site:  L5-S1  Needle: 3.5 in., 20 ga. Tuohy  Needle Placement: Paramedian epidural  Findings:   -Comments: Excellent flow of contrast into the epidural space.  Procedure Details: Using a paramedian approach from the side mentioned above, the region overlying the inferior lamina was localized under fluoroscopic visualization and the soft tissues overlying this structure were infiltrated with 4 ml. of 1% Lidocaine without Epinephrine. The Tuohy needle was inserted into the epidural space using a paramedian approach.   The epidural space was localized using loss of resistance along with counter oblique bi-planar fluoroscopic views.  After negative aspirate for air, blood, and CSF, a 2 ml. volume of Isovue-250 was injected into the epidural space and the flow of contrast was observed. Radiographs were obtained for documentation purposes.    The injectate was administered into the level noted above.   Additional Comments:  No complications occurred Dressing: 2 x 2 sterile gauze and Band-Aid    Post-procedure details: Patient was observed during the procedure. Post-procedure instructions were reviewed.  Patient left the clinic in  stable condition.

## 2021-07-17 ENCOUNTER — Other Ambulatory Visit: Payer: Self-pay

## 2021-07-17 ENCOUNTER — Ambulatory Visit: Payer: No Typology Code available for payment source | Admitting: Family Medicine

## 2021-07-17 ENCOUNTER — Encounter: Payer: Self-pay | Admitting: Family Medicine

## 2021-07-17 ENCOUNTER — Ambulatory Visit (INDEPENDENT_AMBULATORY_CARE_PROVIDER_SITE_OTHER): Payer: No Typology Code available for payment source

## 2021-07-17 VITALS — BP 100/70 | HR 97 | Temp 98.2°F | Ht 73.0 in | Wt 163.9 lb

## 2021-07-17 DIAGNOSIS — E870 Hyperosmolality and hypernatremia: Secondary | ICD-10-CM

## 2021-07-17 DIAGNOSIS — D72829 Elevated white blood cell count, unspecified: Secondary | ICD-10-CM

## 2021-07-17 DIAGNOSIS — Z23 Encounter for immunization: Secondary | ICD-10-CM | POA: Diagnosis not present

## 2021-07-17 DIAGNOSIS — R5383 Other fatigue: Secondary | ICD-10-CM

## 2021-07-17 DIAGNOSIS — K219 Gastro-esophageal reflux disease without esophagitis: Secondary | ICD-10-CM

## 2021-07-17 DIAGNOSIS — R197 Diarrhea, unspecified: Secondary | ICD-10-CM | POA: Diagnosis not present

## 2021-07-17 DIAGNOSIS — R059 Cough, unspecified: Secondary | ICD-10-CM | POA: Diagnosis not present

## 2021-07-17 DIAGNOSIS — R7989 Other specified abnormal findings of blood chemistry: Secondary | ICD-10-CM

## 2021-07-17 DIAGNOSIS — Z125 Encounter for screening for malignant neoplasm of prostate: Secondary | ICD-10-CM

## 2021-07-17 DIAGNOSIS — Z13220 Encounter for screening for lipoid disorders: Secondary | ICD-10-CM

## 2021-07-17 MED ORDER — VARENICLINE TARTRATE 0.5 MG PO TABS: ORAL_TABLET | ORAL | 0 refills | Status: DC

## 2021-07-17 MED ORDER — PANTOPRAZOLE SODIUM 40 MG PO TBEC: 40.0000 mg | DELAYED_RELEASE_TABLET | Freq: Every day | ORAL | 1 refills | Status: DC

## 2021-07-17 NOTE — Patient Instructions (Signed)
Take protonix 30 minutes prior to dinner. If you have breakthrough reflux symptoms; take pepcid over the counter. You can take the pepcid twice daily (morning/bedtime).

## 2021-07-17 NOTE — Progress Notes (Signed)
Haylen Shelnutt DOB: 16-Apr-1976 Encounter date: 07/17/2021  This is a 45 y.o. male who presents with Chief Complaint  Patient presents with   Follow-up   Fatigue    History of present illness: Last visit with me was 06/2018 to establish care.  Has been getting lumbar epidural steroid injections for ongoing back pain. Back is better, but not great. Doesn't want surgery.   Feels like after hitting 42, he is tired. Just doesn't have energy he used to. Other people he talks to taking testosterone.   Sleeping ok if heartburn not keeping him up. Has to stay away from a lot of foods, alcohol, sodas, fruit juice. Had scope back in 2019; chronic gastritis. Takes tums, pepto which helps. Bowels are moving ok. Has had more lose stools last couple of months. Generally always loose. No blood in stools.   Headaches are doing better; better control.   Warts almost disappeared but then came back. Tried topical treatment. One on chin, finger, back of leg, private.   Has been smoking a long time. Would like lung evaluation. Mild cough. Would like to stop smoking. Chantix worked in the past.   No Known Allergies Current Meds  Medication Sig   alprazolam (XANAX) 2 MG tablet Take 2 mg by mouth 2 (two) times daily.   amphetamine-dextroamphetamine (ADDERALL) 10 MG tablet Take 10 mg by mouth 2 (two) times daily.   SUMAtriptan (IMITREX) 100 MG tablet Take 1 tab every 2 hrs for migraine. Repeat in 2 hrs if needed. PATIENT NEEDS OFFICE VISIT FOR ADDITIONAL REFILLS (Patient taking differently: Take 1/2 tab every 2 hrs for migraine. Repeat in 2 hrs if needed.)   [DISCONTINUED] ALPRAZOLAM XR 0.5 MG 24 hr tablet Take 0.5 mg by mouth 2 (two) times daily.    Review of Systems  Constitutional:  Positive for fatigue. Negative for chills and fever.  Respiratory:  Negative for cough, chest tightness, shortness of breath and wheezing.   Cardiovascular:  Negative for chest pain, palpitations and leg swelling.   Gastrointestinal:        Heartburn  Musculoskeletal:  Positive for back pain.  Neurological:  Positive for headaches (improved in frequency, severity).   Objective:  BP 100/70 (BP Location: Right Arm, Patient Position: Sitting, Cuff Size: Large)   Pulse 97   Temp 98.2 F (36.8 C) (Oral)   Ht 6\' 1"  (1.854 m)   Wt 163 lb 14.4 oz (74.3 kg)   SpO2 95%   BMI 21.62 kg/m   Weight: 163 lb 14.4 oz (74.3 kg)   BP Readings from Last 3 Encounters:  07/17/21 100/70  06/10/21 124/81  05/16/21 (!) 145/82   Wt Readings from Last 3 Encounters:  07/17/21 163 lb 14.4 oz (74.3 kg)  01/27/20 154 lb 6.4 oz (70 kg)  05/10/19 160 lb (72.6 kg)    Physical Exam Constitutional:      General: He is not in acute distress.    Appearance: He is well-developed.  Cardiovascular:     Rate and Rhythm: Normal rate and regular rhythm.     Heart sounds: Normal heart sounds. No murmur heard.   No friction rub.  Pulmonary:     Effort: Pulmonary effort is normal. No respiratory distress.     Breath sounds: Normal breath sounds. No wheezing or rales.  Musculoskeletal:     Right lower leg: No edema.     Left lower leg: No edema.  Lymphadenopathy:     Head:     Right side  of head: No submental, submandibular, preauricular or occipital adenopathy.     Left side of head: No submental, submandibular, preauricular or occipital adenopathy.     Cervical: No cervical adenopathy.     Upper Body:     Right upper body: No supraclavicular or axillary adenopathy.     Left upper body: No supraclavicular or axillary adenopathy.  Neurological:     Mental Status: He is alert and oriented to person, place, and time.  Psychiatric:        Behavior: Behavior normal.    Assessment/Plan 1. Gastroesophageal reflux disease, unspecified whether esophagitis present Protonix 40mg  daily x 1 month suggested; can taper down after that time. He is not interested in further scoping or GI follow up at this time. Discussed triggers  for reflux.   2. Diarrhea, unspecified type - Fecal leukocytes; Future - Stool culture; Future - Clostridium Difficile by PCR; Future - Ova and parasite examination; Future  3. Fatigue, unspecified type - CBC with Differential/Platelet; Future - Comprehensive metabolic panel; Future - TSH; Future - Vitamin B12; Future - VITAMIN D 25 Hydroxy (Vit-D Deficiency, Fractures); Future - Homocysteine; Future - Testosterone; Future - Folate; Future - Methylmalonic acid, serum; Future - Iron, TIBC and Ferritin Panel; Future - Iron, TIBC and Ferritin Panel - Methylmalonic acid, serum - Folate - Testosterone - Homocysteine - VITAMIN D 25 Hydroxy (Vit-D Deficiency, Fractures) - Vitamin B12 - TSH - Comprehensive metabolic panel - CBC with Differential/Platelet  4. Cough, unspecified type He is interested in quitting smoking. Chantix sent in for him today; he has been successful with this in the past. Ok for continuing pack if desired.  - DG Chest 2 View; Future  5. Lipid screening - Lipid panel; Future - Lipid panel  6. Leukocytosis, unspecified type - Pathologist smear review; Future - Pathologist smear review  7. Prostate cancer screening - PSA; Future - PSA  8. Need for immunization against influenza - Flu Vaccine QUAD 6+ mos PF IM (Fluarix Quad PF)    Return for pending bloodwork. 40 minutes spent in chart review, time with patient, exam, charting.     Micheline Rough, MD

## 2021-07-19 LAB — LIPID PANEL
Cholesterol: 214 mg/dL — ABNORMAL HIGH (ref <200)
HDL: 52 mg/dL (ref >=40)
LDL Cholesterol (Calc): 123 mg/dL (calc) — ABNORMAL HIGH
Non-HDL Cholesterol (Calc): 162 mg/dL (calc) — ABNORMAL HIGH (ref <130)
Total CHOL/HDL Ratio: 4.1 (calc) (ref <5.0)
Triglycerides: 252 mg/dL — ABNORMAL HIGH (ref <150)

## 2021-07-19 LAB — PATHOLOGIST SMEAR REVIEW

## 2021-07-19 LAB — CBC WITH DIFFERENTIAL/PLATELET
Absolute Monocytes: 1134 cells/uL — ABNORMAL HIGH (ref 200–950)
Basophils Absolute: 63 cells/uL (ref 0–200)
Basophils Relative: 0.5 %
Eosinophils Absolute: 227 cells/uL (ref 15–500)
Eosinophils Relative: 1.8 %
HCT: 48.4 % (ref 38.5–50.0)
Hemoglobin: 16.9 g/dL (ref 13.2–17.1)
Lymphs Abs: 4095 cells/uL — ABNORMAL HIGH (ref 850–3900)
MCH: 31.4 pg (ref 27.0–33.0)
MCHC: 34.9 g/dL (ref 32.0–36.0)
MCV: 90 fL (ref 80.0–100.0)
MPV: 11.7 fL (ref 7.5–12.5)
Monocytes Relative: 9 %
Neutro Abs: 7081 cells/uL (ref 1500–7800)
Neutrophils Relative %: 56.2 %
Platelets: 203 10*3/uL (ref 140–400)
RBC: 5.38 10*6/uL (ref 4.20–5.80)
RDW: 12.6 % (ref 11.0–15.0)
Total Lymphocyte: 32.5 %
WBC: 12.6 10*3/uL — ABNORMAL HIGH (ref 3.8–10.8)

## 2021-07-19 LAB — IRON,TIBC AND FERRITIN PANEL
%SAT: 31 % (calc) (ref 20–48)
Ferritin: 99 ng/mL (ref 38–380)
Iron: 151 ug/dL (ref 50–180)
TIBC: 484 mcg/dL (calc) — ABNORMAL HIGH (ref 250–425)

## 2021-07-19 LAB — VITAMIN D 25 HYDROXY (VIT D DEFICIENCY, FRACTURES): Vit D, 25-Hydroxy: 40 ng/mL (ref 30–100)

## 2021-07-19 LAB — COMPREHENSIVE METABOLIC PANEL
AG Ratio: 1.8 (calc) (ref 1.0–2.5)
ALT: 27 U/L (ref 9–46)
AST: 25 U/L (ref 10–40)
Albumin: 5.3 g/dL — ABNORMAL HIGH (ref 3.6–5.1)
Alkaline phosphatase (APISO): 86 U/L (ref 36–130)
BUN/Creatinine Ratio: 12 (calc) (ref 6–22)
BUN: 15 mg/dL (ref 7–25)
CO2: 22 mmol/L (ref 20–32)
Calcium: 11.1 mg/dL — ABNORMAL HIGH (ref 8.6–10.3)
Chloride: 108 mmol/L (ref 98–110)
Creat: 1.3 mg/dL — ABNORMAL HIGH (ref 0.60–1.29)
Globulin: 3 g/dL (calc) (ref 1.9–3.7)
Glucose, Bld: 87 mg/dL (ref 65–99)
Potassium: 4.7 mmol/L (ref 3.5–5.3)
Sodium: 156 mmol/L — ABNORMAL HIGH (ref 135–146)
Total Bilirubin: 0.5 mg/dL (ref 0.2–1.2)
Total Protein: 8.3 g/dL — ABNORMAL HIGH (ref 6.1–8.1)

## 2021-07-19 LAB — FOLATE: Folate: 13.2 ng/mL

## 2021-07-19 LAB — VITAMIN B12: Vitamin B-12: 539 pg/mL (ref 200–1100)

## 2021-07-19 LAB — TSH: TSH: 1.95 mIU/L (ref 0.40–4.50)

## 2021-07-19 LAB — METHYLMALONIC ACID, SERUM: Methylmalonic Acid, Quant: 161 nmol/L (ref 87–318)

## 2021-07-19 LAB — HOMOCYSTEINE: Homocysteine: 16.4 umol/L — ABNORMAL HIGH (ref <11.4)

## 2021-07-19 LAB — PSA: PSA: 1.53 ng/mL (ref <=4.00)

## 2021-07-19 LAB — TESTOSTERONE: Testosterone: 601 ng/dL (ref 250–827)

## 2021-07-23 NOTE — Addendum Note (Signed)
Addended by: Agnes Lawrence on: 07/23/2021 10:56 AM   Modules accepted: Orders

## 2021-08-06 ENCOUNTER — Other Ambulatory Visit: Payer: No Typology Code available for payment source

## 2021-10-25 ENCOUNTER — Other Ambulatory Visit: Payer: No Typology Code available for payment source

## 2021-10-25 ENCOUNTER — Encounter: Payer: Self-pay | Admitting: Adult Health

## 2021-10-25 ENCOUNTER — Ambulatory Visit (INDEPENDENT_AMBULATORY_CARE_PROVIDER_SITE_OTHER): Payer: No Typology Code available for payment source | Admitting: Adult Health

## 2021-10-25 VITALS — BP 120/78 | HR 96 | Temp 98.6°F | Ht 73.0 in | Wt 169.0 lb

## 2021-10-25 DIAGNOSIS — R1084 Generalized abdominal pain: Secondary | ICD-10-CM

## 2021-10-25 MED ORDER — SUCRALFATE 1 G PO TABS
1.0000 g | ORAL_TABLET | Freq: Three times a day (TID) | ORAL | 0 refills | Status: DC
Start: 2021-10-25 — End: 2023-03-23

## 2021-10-25 NOTE — Progress Notes (Signed)
? ?Subjective:  ? ? Patient ID: Ralph Lang, male    DOB: 1975/11/13, 46 y.o.   MRN: [id number] ? ?HPI ?46 year old male who  has a past medical history of Anxiety, GERD (gastroesophageal reflux disease), Kidney stone, Medial epicondylitis of elbow, left (04/12/2019), Migraine, and Panic attack. ? ?He is a patient of Dr. Inocente Salles who I am seeing today for abdominal pain.  ? ?He was last seen for this issue by his PCP about 3 months ago.  At this time his heartburn was interfering with his life.  He did have a upper endoscopy back in 2019 which showed chronic gastritis.  He was taking Tums, and Pepto which seem to help.  He was also having loose stools for few months prior to that visit but had not noticed any blood in his stools. ? ?Started on Protonix and reports that this has resolved his heartburn but he continues to have abdominal pain, "lots of diarrhea, and is vomiting almost every morning of the week, all the symptoms have been going on for months.  He has not noticed any blood in his stool or any blood in his emesis.  Pain is described as a burning sensation.  He has noted he will have increased abdominal pain after heavy meal.  He does not eat very healthy.  May have 2-3 alcoholic beverages at night ? ?He also had a colonoscopy at the time of his endoscopy which showed ? ?Non-thrombosed external hemorrhoids, non-thrombosed internal hemorrhoids and internal ?hemorrhoids that prolapse with straining, but require manual replacement into the anal canal ?(Grade III) found on digital rectal exam. ?- The examined portion of the ileum was normal. Biopsied. ?- Two 2 to 4 mm polyps in the descending colon, removed with a cold snare. Resected and ?retrieved. ?- Two 1 to 2 mm polyps in the rectum, removed with a jumbo cold forceps. Resected and ?retrieved. ?- One 10 mm polyp at the recto-sigmoid colon, removed with mucosal resection. Resected ?and retrieved. Treated with a hot snare tip to decrease recurrence. ?-  Diverticulosis in the sigmoid colon and in the descending colon. ?- Normal mucosa in the entire examined colon otherwise. Biopsied. ?- Inflammed, partially bleeding non-thrombosed external and internal hemorrhoids. ? ? ? ?Review of Systems ?See HPI  ? ?Past Medical History:  ?Diagnosis Date  ? Anxiety   ? GERD (gastroesophageal reflux disease)   ? Kidney stone   ? Medial epicondylitis of elbow, left 04/12/2019  ? Migraine   ? Panic attack   ? ? ?Social History  ? ?Socioeconomic History  ? Marital status: Married  ?  Spouse name: Not on file  ? Number of children: 1  ? Years of education: Not on file  ? Highest education level: Not on file  ?Occupational History  ? Occupation: Engineer, building services  ?Tobacco Use  ? Smoking status: Every Day  ?  Types: Cigarettes  ? Smokeless tobacco: Never  ?Vaping Use  ? Vaping Use: Former  ?Substance and Sexual Activity  ? Alcohol use: Yes  ?  Comment: once a month  ? Drug use: Yes  ?  Types: Marijuana  ?  Comment: last had 3-4 weeks ago  ? Sexual activity: Yes  ?  Partners: Female  ?Other Topics Concern  ? Not on file  ?Social History Narrative  ? Not on file  ? ?Social Determinants of Health  ? ?Financial Resource Strain: Not on file  ?Food Insecurity: Not on file  ?Transportation Needs: Not on  file  ?Physical Activity: Not on file  ?Stress: Not on file  ?Social Connections: Not on file  ?Intimate Partner Violence: Not on file  ? ? ?Past Surgical History:  ?Procedure Laterality Date  ? APPENDECTOMY    ? COLONOSCOPY WITH ESOPHAGOGASTRODUODENOSCOPY (EGD)  06/30/2018  ? CYST REMOVAL HAND    ? CYST REMOVAL NECK    ? LACERATION REPAIR Right   ? wrist  ? ? ?Family History  ?Problem Relation Age of Onset  ? Hypertension Mother   ? Migraines Mother   ? Hypertension Father   ? Migraines Brother   ? Migraines Maternal Grandmother   ? Alzheimer's disease Maternal Grandmother   ? Colon cancer Paternal Uncle   ? Esophageal cancer Neg Hx   ? Inflammatory bowel disease Neg Hx   ? Liver disease Neg Hx    ? Pancreatic cancer Neg Hx   ? Rectal cancer Neg Hx   ? Stomach cancer Neg Hx   ? ? ?No Known Allergies ? ?Current Outpatient Medications on File Prior to Visit  ?Medication Sig Dispense Refill  ? alprazolam (XANAX) 2 MG tablet Take 2 mg by mouth 2 (two) times daily.    ? amphetamine-dextroamphetamine (ADDERALL) 10 MG tablet Take 10 mg by mouth 2 (two) times daily.    ? pantoprazole (PROTONIX) 40 MG tablet Take 1 tablet (40 mg total) by mouth daily. 90 tablet 1  ? SUMAtriptan (IMITREX) 100 MG tablet Take 1 tab every 2 hrs for migraine. Repeat in 2 hrs if needed. PATIENT NEEDS OFFICE VISIT FOR ADDITIONAL REFILLS (Patient taking differently: Take 1/2 tab every 2 hrs for migraine. Repeat in 2 hrs if needed.) 20 tablet 0  ? varenicline (CHANTIX) 0.5 MG tablet Start with 0.5mg  PO daily x 3 days, the increase to 0.5mg  PO BID x 4 days, then increase to 2 tablets (1mg ) PO daily 60 tablet 0  ? ?No current facility-administered medications on file prior to visit.  ? ? ?BP 120/78   Pulse 96   Temp 98.6 ?F (37 ?C) (Oral)   Ht 6\' 1"  (1.854 m)   Wt 169 lb (76.7 kg)   SpO2 100%   BMI 22.30 kg/m?  ? ? ?   ?Objective:  ? Physical Exam ?Vitals and nursing note reviewed.  ?Constitutional:   ?   Appearance: He is well-developed.  ?Cardiovascular:  ?   Rate and Rhythm: Normal rate and regular rhythm.  ?   Pulses: Normal pulses.  ?   Heart sounds: Normal heart sounds.  ?Pulmonary:  ?   Effort: Pulmonary effort is normal.  ?   Breath sounds: Normal breath sounds.  ?Abdominal:  ?   General: Abdomen is flat. Bowel sounds are normal.  ?   Palpations: Abdomen is soft.  ?   Tenderness: There is abdominal tenderness in the right upper quadrant, epigastric area and periumbilical area. There is no guarding or rebound. Positive signs include Murphy's sign (?). Negative signs include McBurney's sign.  ?   Hernia: No hernia is present.  ?Neurological:  ?   General: No focal deficit present.  ?   Mental Status: He is alert and oriented to  person, place, and time.  ?Psychiatric:     ?   Mood and Affect: Mood normal.     ?   Behavior: Behavior normal.     ?   Thought Content: Thought content normal.  ? ? ?   ?Assessment & Plan:  ?1. Generalized abdominal pain ?-Did have some  right upper quadrant pain with palpation and this seems to be the area of more discomfort after eating.  The rest of his pain was more epigastric and periumbilical.  We will check CBC, amylase, H. pylori, and CMP and also order ultrasound of the abdomen right upper quadrant to rule out gallbladder disease. ?-Does not want to have another scope done at this time ?- US Abdomen Limited RUQ (LIVER/GB); Future ?- CBC with Differential/Platelets; Future ?- Amylase; Future ?- H. pylori Antibody, IgG; Future ?- CMP; Future ? ?Dorothyann Peng, NP ? ?

## 2021-10-28 ENCOUNTER — Other Ambulatory Visit (INDEPENDENT_AMBULATORY_CARE_PROVIDER_SITE_OTHER): Payer: No Typology Code available for payment source

## 2021-10-28 DIAGNOSIS — R1084 Generalized abdominal pain: Secondary | ICD-10-CM | POA: Diagnosis not present

## 2021-10-28 LAB — CBC WITH DIFFERENTIAL/PLATELET
Basophils Absolute: 0.1 10*3/uL (ref 0.0–0.1)
Basophils Relative: 0.5 % (ref 0.0–3.0)
Eosinophils Absolute: 0.2 10*3/uL (ref 0.0–0.7)
Eosinophils Relative: 1.4 % (ref 0.0–5.0)
HCT: 47.2 % (ref 39.0–52.0)
Hemoglobin: 16 g/dL (ref 13.0–17.0)
Lymphocytes Relative: 23.4 % (ref 12.0–46.0)
Lymphs Abs: 2.8 10*3/uL (ref 0.7–4.0)
MCHC: 33.9 g/dL (ref 30.0–36.0)
MCV: 91.4 fl (ref 78.0–100.0)
Monocytes Absolute: 0.8 10*3/uL (ref 0.1–1.0)
Monocytes Relative: 6.5 % (ref 3.0–12.0)
Neutro Abs: 8.1 10*3/uL — ABNORMAL HIGH (ref 1.4–7.7)
Neutrophils Relative %: 68.2 % (ref 43.0–77.0)
Platelets: 170 10*3/uL (ref 150.0–400.0)
RBC: 5.17 Mil/uL (ref 4.22–5.81)
RDW: 13.4 % (ref 11.5–15.5)
WBC: 11.9 10*3/uL — ABNORMAL HIGH (ref 4.0–10.5)

## 2021-10-28 LAB — COMPREHENSIVE METABOLIC PANEL
ALT: 26 U/L (ref 0–53)
AST: 23 U/L (ref 0–37)
Albumin: 4.6 g/dL (ref 3.5–5.2)
Alkaline Phosphatase: 79 U/L (ref 39–117)
BUN: 14 mg/dL (ref 6–23)
CO2: 30 mEq/L (ref 19–32)
Calcium: 10.1 mg/dL (ref 8.4–10.5)
Chloride: 101 mEq/L (ref 96–112)
Creatinine, Ser: 1.08 mg/dL (ref 0.40–1.50)
GFR: 83.98 mL/min (ref >60.00)
Glucose, Bld: 95 mg/dL (ref 70–99)
Potassium: 4.1 mEq/L (ref 3.5–5.1)
Sodium: 140 mEq/L (ref 135–145)
Total Bilirubin: 0.4 mg/dL (ref 0.2–1.2)
Total Protein: 7.5 g/dL (ref 6.0–8.3)

## 2021-10-28 LAB — AMYLASE: Amylase: 42 U/L (ref 27–131)

## 2021-10-28 LAB — H. PYLORI ANTIBODY, IGG: H Pylori IgG: NEGATIVE

## 2022-01-10 ENCOUNTER — Encounter: Payer: Self-pay | Admitting: Gastroenterology

## 2022-04-24 ENCOUNTER — Encounter: Payer: Self-pay | Admitting: Family Medicine

## 2022-04-24 ENCOUNTER — Ambulatory Visit: Payer: No Typology Code available for payment source | Admitting: Family Medicine

## 2022-04-24 VITALS — BP 122/78 | HR 86 | Temp 98.2°F | Wt 165.6 lb

## 2022-04-24 DIAGNOSIS — S90465A Insect bite (nonvenomous), left lesser toe(s), initial encounter: Secondary | ICD-10-CM

## 2022-04-24 DIAGNOSIS — W57XXXA Bitten or stung by nonvenomous insect and other nonvenomous arthropods, initial encounter: Secondary | ICD-10-CM

## 2022-04-24 DIAGNOSIS — H6982 Other specified disorders of Eustachian tube, left ear: Secondary | ICD-10-CM | POA: Diagnosis not present

## 2022-04-24 DIAGNOSIS — R200 Anesthesia of skin: Secondary | ICD-10-CM

## 2022-04-24 MED ORDER — LORATADINE 10 MG PO TABS: 10.0000 mg | ORAL_TABLET | Freq: Every day | ORAL | 0 refills | Status: DC

## 2022-04-24 MED ORDER — FLUTICASONE PROPIONATE 50 MCG/ACT NA SUSP: 1.0000 | Freq: Every day | NASAL | 0 refills | Status: DC

## 2022-04-24 NOTE — Progress Notes (Signed)
Subjective:    Patient ID: Ralph Lang, male    DOB: August 26, 1975, 46 y.o.   MRN: [id number]  Chief Complaint  Patient presents with   Toe Pain    Something bit him on the left pinky toe about 1.5 wks ago. Not sure what it was, now toe is numb and tingly, also shows meat is starting to rub off in between the two toes. Used 3 in 1 ointment and a bandage but has not helped.    HPI Patient is a 46 year old male previously seen by Dr. Ethlyn Gallery who was seen today for acute concern.  Patient endorses bug bite on left fifth toe x1.5 weeks ago..  Initially thought area was mosquito bite but it has not resolved.  Patient endorses numbness in left fifth toe.  Denies pruritus or pain.  Noticed a second area on medial side of left fifth toe possibly from toe rubbing against the other toe.  Tried triple antibiotic ointment.  Patient denies fever, chills, nausea, vomiting, erythema, drainage.  Patient wears steel toe shoes to work.  Patient wanted to get area checked as he is scheduled to go on a 5-day cruise leaving Saturday from [place].  Patient also endorses left ear feeling clogged/jaw discomfort.  Has been swimming more over the last few weeks.  Tried to get the ear to pop without success.    Past Medical History:  Diagnosis Date   Anxiety    GERD (gastroesophageal reflux disease)    Kidney stone    Medial epicondylitis of elbow, left 04/12/2019   Migraine    Panic attack     No Known Allergies  ROS General: Denies fever, chills, night sweats, changes in weight, changes in appetite HEENT: Denies headaches, ear pain, changes in vision, rhinorrhea, sore throat + clogged left ear CV: Denies CP, palpitations, SOB, orthopnea Pulm: Denies SOB, cough, wheezing GI: Denies abdominal pain, nausea, vomiting, diarrhea, constipation GU: Denies dysuria, hematuria, frequency Msk: Denies muscle cramps, joint pains Neuro: Denies weakness, numbness, tingling Skin: Denies rashes, bruising + bite on  left fifth toe Psych: Denies depression, anxiety, hallucinations     Objective:    Blood pressure 122/78, pulse 86, temperature 98.2 F (36.8 C), temperature source Oral, weight 165 lb 9.6 oz (75.1 kg), SpO2 96 %.  Gen. Pleasant, well-nourished, in no distress, normal affect   HEENT: [place]/AT, face symmetric, conjunctiva clear, no scleral icterus, PERRLA, EOMI, nares patent without drainage, pharynx without erythema or exudate.  Right external ear, canal, TM normal.  Left external ear, canal, normal.  TM full without erythema.  TTP of lateral left neck.  No cervical lymphadenopathy. Lungs: no accessory muscle use Cardiovascular: RRR, no peripheral edema Neuro:  A&Ox3, CN II-XII intact, normal gait Skin:  Warm, dry, intact.  Left fifth toe with a 4 mm erythematous papule with central punctum, no increased warmth, induration, or drainage noted.  Medial side of left fifth digit with smaller similar lesion with area of eschar.   Wt Readings from Last 3 Encounters:  04/24/22 165 lb 9.6 oz (75.1 kg)  10/25/21 169 lb (76.7 kg)  07/17/21 163 lb 14.4 oz (74.3 kg)    Lab Results  Component Value Date   WBC 11.9 (H) 10/28/2021   HGB 16.0 10/28/2021   HCT 47.2 10/28/2021   PLT 170.0 10/28/2021   GLUCOSE 95 10/28/2021   CHOL 214 (H) 07/17/2021   TRIG 252 (H) 07/17/2021   HDL 52 07/17/2021   LDLCALC 123 (H) 07/17/2021   ALT  26 10/28/2021   AST 23 10/28/2021   NA 140 10/28/2021   K 4.1 10/28/2021   CL 101 10/28/2021   CREATININE 1.08 10/28/2021   BUN 14 10/28/2021   CO2 30 10/28/2021   TSH 1.95 07/17/2021   PSA 1.53 07/17/2021   HGBA1C 5.4 06/02/2011    Assessment/Plan:  Dysfunction of left eustachian tube -Discussed supportive care with use of OTC antihistamine such as Claritin or Zyrtec.  Can also use saline nasal rinse or Flonase nasal spray -Given samples of Zyrtec  - Plan: fluticasone (FLONASE) 50 MCG/ACT nasal spray, loratadine (CLARITIN) 10 MG tablet  Insect bite of lesser  toe of left foot, initial encounter -Healing without infection -Discussed S/S of infection -Continue supportive care.  Advised to let area get air, keep clean and dry -Continue to monitor.  Given precautions.  Numbness of toe  F/u as needed for continued or worsening symptoms  Grier Mitts, MD

## 2022-05-09 ENCOUNTER — Ambulatory Visit: Payer: No Typology Code available for payment source | Admitting: Family Medicine

## 2022-05-09 ENCOUNTER — Encounter: Payer: Self-pay | Admitting: Family Medicine

## 2022-05-09 ENCOUNTER — Other Ambulatory Visit: Payer: Self-pay | Admitting: Family Medicine

## 2022-05-09 VITALS — BP 120/82 | HR 94 | Temp 98.3°F | Ht 73.0 in | Wt 164.8 lb

## 2022-05-09 DIAGNOSIS — K219 Gastro-esophageal reflux disease without esophagitis: Secondary | ICD-10-CM | POA: Diagnosis not present

## 2022-05-09 DIAGNOSIS — G43909 Migraine, unspecified, not intractable, without status migrainosus: Secondary | ICD-10-CM

## 2022-05-09 DIAGNOSIS — B353 Tinea pedis: Secondary | ICD-10-CM

## 2022-05-09 DIAGNOSIS — S90852A Superficial foreign body, left foot, initial encounter: Secondary | ICD-10-CM | POA: Diagnosis not present

## 2022-05-09 DIAGNOSIS — K529 Noninfective gastroenteritis and colitis, unspecified: Secondary | ICD-10-CM

## 2022-05-09 MED ORDER — RIZATRIPTAN BENZOATE 10 MG PO TABS: 10.0000 mg | ORAL_TABLET | ORAL | 0 refills | Status: DC | PRN

## 2022-05-09 MED ORDER — TOPIRAMATE 50 MG PO TABS: ORAL_TABLET | ORAL | 0 refills | Status: DC

## 2022-05-09 MED ORDER — PANTOPRAZOLE SODIUM 40 MG PO TBEC: 40.0000 mg | DELAYED_RELEASE_TABLET | Freq: Every day | ORAL | 1 refills | Status: DC

## 2022-05-09 MED ORDER — CLOTRIMAZOLE-BETAMETHASONE 1-0.05 % EX CREA: 1.0000 | TOPICAL_CREAM | Freq: Two times a day (BID) | CUTANEOUS | 0 refills | Status: AC

## 2022-05-09 NOTE — Progress Notes (Unsigned)
   Established Patient Office Visit  Subjective   Patient ID: Ralph Lang, male    DOB: [birth year]  Age: 46 y.o. MRN: [id number]  Chief Complaint  Patient presents with   Establish Care    Patient is here for transition of care     {History (Optional):23778}  Review of Systems  All other systems reviewed and are negative.     Objective:     BP 120/82 (BP Location: Left Arm, Patient Position: Sitting, Cuff Size: Normal)   Pulse 94   Temp 98.3 F (36.8 C) (Oral)   Ht '6\' 1"'$  (1.854 m)   Wt 164 lb 12.8 oz (74.8 kg)   SpO2 96%   BMI 21.74 kg/m  {Vitals History (Optional):23777}  Physical Exam Vitals reviewed.  Constitutional:      Appearance: Normal appearance. He is well-groomed and normal weight.  HENT:     Head: Normocephalic and atraumatic.  Eyes:     Extraocular Movements: Extraocular movements intact.     Conjunctiva/sclera: Conjunctivae normal.     Pupils: Pupils are equal, round, and reactive to light.  Cardiovascular:     Rate and Rhythm: Normal rate and regular rhythm.     Pulses: Normal pulses.     Heart sounds: S1 normal and S2 normal. No murmur heard. Pulmonary:     Effort: Pulmonary effort is normal.     Breath sounds: Normal breath sounds and air entry. No rales.  Abdominal:     General: Abdomen is flat. Bowel sounds are normal.     Palpations: Abdomen is soft.     Tenderness: There is no abdominal tenderness.  Musculoskeletal:     Right lower leg: No edema.     Left lower leg: No edema.       Feet:  Feet:     Comments: Foreign body on the bottom of left foot, there is scaling and redness beween the 4th and 5th toes of the left foot Neurological:     General: No focal deficit present.     Mental Status: He is alert and oriented to person, place, and time.     Gait: Gait is intact.  Psychiatric:        Mood and Affect: Mood and affect normal.      No results found for any visits on 05/09/22.  {Labs (Optional):23779}  The  10-year ASCVD risk score (Arnett DK, et al., 2019) is: 4.8%    Assessment & Plan:   Problem List Items Addressed This Visit       Cardiovascular and Mediastinum   Migraine - Primary   Relevant Medications   rizatriptan (MAXALT) 10 MG tablet   topiramate (TOPAMAX) 50 MG tablet     Musculoskeletal and Integument   Tinea pedis, left   Relevant Medications   clotrimazole-betamethasone (LOTRISONE) cream     Other   Foreign body in foot, left   Relevant Orders   Ambulatory referral to Podiatry   Other Visit Diagnoses     GERD without esophagitis       Relevant Medications   pantoprazole (PROTONIX) 40 MG tablet   Other Relevant Orders   Ambulatory referral to Gastroenterology   Chronic diarrhea       Relevant Orders   Ambulatory referral to Gastroenterology       No follow-ups on file.    Farrel Conners, MD

## 2022-05-14 ENCOUNTER — Ambulatory Visit: Payer: No Typology Code available for payment source | Admitting: Podiatry

## 2022-05-15 DIAGNOSIS — K219 Gastro-esophageal reflux disease without esophagitis: Secondary | ICD-10-CM | POA: Insufficient documentation

## 2022-05-15 NOTE — Assessment & Plan Note (Signed)
Multiple migraines per month, at least once a week lasting 2-3 days at a time. Will start topiramate and patient was instructed on how to titrate the dose up. If there is no improvement then we will send to neurology for further evaluation. Goal is to get him up to 50 mg twice a day on the topiramate. Will continue the maxalt at 10 mg daily as needed for acute migraines. rx's written

## 2022-05-15 NOTE — Assessment & Plan Note (Signed)
Attempt was made to remove the foreign body in the office, this was unsuccessful so I will send him to podiatry for further evaluation

## 2022-05-15 NOTE — Assessment & Plan Note (Addendum)
Severe sx despite use of PPI, Continue protonix 40 mg daily and I have placed a referral to GI for further recommendations and work up.

## 2022-05-15 NOTE — Assessment & Plan Note (Signed)
Treating with lotrisone cream BID for 7-10 days. Pt instructed to keep his feet dry to help prevent re-infection

## 2022-06-28 ENCOUNTER — Ambulatory Visit: Payer: No Typology Code available for payment source | Admitting: Podiatry

## 2022-06-28 ENCOUNTER — Ambulatory Visit (INDEPENDENT_AMBULATORY_CARE_PROVIDER_SITE_OTHER): Payer: No Typology Code available for payment source

## 2022-06-28 DIAGNOSIS — S90852A Superficial foreign body, left foot, initial encounter: Secondary | ICD-10-CM | POA: Diagnosis not present

## 2022-06-28 MED ORDER — CEPHALEXIN 500 MG PO CAPS: 500.0000 mg | ORAL_CAPSULE | Freq: Three times a day (TID) | ORAL | 0 refills | Status: DC

## 2022-06-28 NOTE — Progress Notes (Signed)
Subjective:   Patient ID: Ralph Lang, male   DOB: 46 y.o.   MRN: [id number]   HPI Chief Complaint  Patient presents with  . foreign body    Np L foot, foreign object removal    He was running on [place] sand in the beach and after he had pain.    ROS      Objective:  Physical Exam  ***     Assessment:  ***     Plan:  ***

## 2022-06-28 NOTE — Patient Instructions (Signed)
Apply antibiotic ointment and a bandage on the incision daily. Keep foot elevated, ice and elevate. You can alternate ibuprofen and tylenol to help with pain. Start the antibiotic (keflex) Monitor for any signs/symptoms of infection. Call the office immediately if any occur or go directly to the emergency room. Call with any questions/concerns.

## 2022-07-08 ENCOUNTER — Ambulatory Visit (INDEPENDENT_AMBULATORY_CARE_PROVIDER_SITE_OTHER): Payer: No Typology Code available for payment source

## 2022-07-08 DIAGNOSIS — S90852A Superficial foreign body, left foot, initial encounter: Secondary | ICD-10-CM

## 2022-07-08 NOTE — Progress Notes (Signed)
Post OP Suture removal. 2 sutures were removed from pt left foot. Pt tolerated well. No questions or concerns.

## 2022-10-13 ENCOUNTER — Emergency Department (HOSPITAL_COMMUNITY): Payer: Self-pay

## 2022-10-13 ENCOUNTER — Encounter (HOSPITAL_COMMUNITY): Payer: Self-pay

## 2022-10-13 ENCOUNTER — Emergency Department (HOSPITAL_COMMUNITY)
Admission: EM | Admit: 2022-10-13 | Discharge: 2022-10-13 | Disposition: A | Discharge status: Home / Self Care | Payer: Self-pay | Attending: Emergency Medicine | Admitting: Emergency Medicine

## 2022-10-13 DIAGNOSIS — S29011A Strain of muscle and tendon of front wall of thorax, initial encounter: Secondary | ICD-10-CM | POA: Insufficient documentation

## 2022-10-13 DIAGNOSIS — R0602 Shortness of breath: Secondary | ICD-10-CM | POA: Insufficient documentation

## 2022-10-13 DIAGNOSIS — R079 Chest pain, unspecified: Secondary | ICD-10-CM

## 2022-10-13 DIAGNOSIS — T148XXA Other injury of unspecified body region, initial encounter: Secondary | ICD-10-CM

## 2022-10-13 DIAGNOSIS — X58XXXA Exposure to other specified factors, initial encounter: Secondary | ICD-10-CM | POA: Insufficient documentation

## 2022-10-13 LAB — BASIC METABOLIC PANEL
Anion gap: 12 (ref 5–15)
BUN: 11 mg/dL (ref 6–20)
CO2: 23 mmol/L (ref 22–32)
Calcium: 9.7 mg/dL (ref 8.9–10.3)
Chloride: 103 mmol/L (ref 98–111)
Creatinine, Ser: 1.03 mg/dL (ref 0.61–1.24)
GFR, Estimated: >60 mL/min (ref >60)
Glucose, Bld: 95 mg/dL (ref 70–99)
Potassium: 4 mmol/L (ref 3.5–5.1)
Sodium: 138 mmol/L (ref 135–145)

## 2022-10-13 LAB — CBC
HCT: 46.4 % (ref 39.0–52.0)
Hemoglobin: 16 g/dL (ref 13.0–17.0)
MCH: 31.1 pg (ref 26.0–34.0)
MCHC: 34.5 g/dL (ref 30.0–36.0)
MCV: 90.3 fL (ref 80.0–100.0)
Platelets: 205 10*3/uL (ref 150–400)
RBC: 5.14 MIL/uL (ref 4.22–5.81)
RDW: 12.3 % (ref 11.5–15.5)
WBC: 11.3 10*3/uL — ABNORMAL HIGH (ref 4.0–10.5)
nRBC: 0 % (ref 0.0–0.2)

## 2022-10-13 LAB — TROPONIN I (HIGH SENSITIVITY)
Troponin I (High Sensitivity): 3 ng/L (ref <18)
Troponin I (High Sensitivity): 3 ng/L (ref <18)

## 2022-10-13 LAB — D-DIMER, QUANTITATIVE: D-Dimer, Quant: <0.27 ug/mL-FEU (ref 0.00–0.50)

## 2022-10-13 MED ORDER — LIDOCAINE 5 % EX PTCH
1.0000 | MEDICATED_PATCH | CUTANEOUS | Status: DC
  Administered 2022-10-13: 1 via TRANSDERMAL
  Filled 2022-10-13: qty 1

## 2022-10-13 MED ORDER — LIDOCAINE 4 % EX PTCH: 1.0000 | MEDICATED_PATCH | CUTANEOUS | 0 refills | Status: DC

## 2022-10-13 MED ORDER — KETOROLAC TROMETHAMINE 15 MG/ML IJ SOLN
15.0000 mg | Freq: Once | INTRAMUSCULAR | Status: AC
  Administered 2022-10-13: 15 mg via INTRAVENOUS
  Filled 2022-10-13: qty 1

## 2022-10-13 NOTE — Discharge Instructions (Addendum)
Please follow-up with your primary care doctor, if you have worsening chest pain, shortness of breath please return to the ER.  You can use the lidocaine patches, Tylenol and ibuprofen for your pain control.

## 2022-10-13 NOTE — ED Notes (Signed)
Provider at bedside

## 2022-10-13 NOTE — ED Provider Notes (Signed)
[facility] Provider Note   CSN: [id number] Arrival date & time: 10/13/22  1052     History  Chief Complaint  Patient presents with   Chest Pain    Ralph Lang is a 47 y.o. male, history of anxiety, who presents to the ED 2/2 to chest pain and SOB since Saturday. He states the pain is worse when taking a deep breath and leaning forward. Pain is a 4/10 in nature and dull and achy per patient. Denies N/V. No recent illnesses, trauma, or hx of blood clots. Has not tried anything for pain yet. Reports SOB at rest. Reports hx of tobacco use.   Home Medications Prior to Admission medications   Medication Sig Start Date End Date Taking? Authorizing Provider  lidocaine (HM LIDOCAINE PATCH) 4 % Place 1 patch onto the skin daily. 10/13/22  Yes Shenise Wolgamott L, PA  alprazolam (XANAX) 2 MG tablet Take 2 mg by mouth 2 (two) times daily.    [provider]  cephALEXin (KEFLEX) 500 MG capsule Take 1 capsule (500 mg total) by mouth 3 (three) times daily. 06/28/22   Trula Slade, DPM  fluticasone (FLONASE) 50 MCG/ACT nasal spray Place 1 spray into both nostrils daily. 04/24/22   Billie Ruddy, MD  loratadine (CLARITIN) 10 MG tablet Take 1 tablet (10 mg total) by mouth daily. 04/24/22   Billie Ruddy, MD  pantoprazole (PROTONIX) 40 MG tablet Take 1 tablet (40 mg total) by mouth daily. 05/09/22   Farrel Conners, MD  rizatriptan (MAXALT) 10 MG tablet Take 1 tablet (10 mg total) by mouth as needed for migraine. May repeat in 2 hours if needed 05/09/22   Farrel Conners, MD  sucralfate (CARAFATE) 1 g tablet Take 1 tablet (1 g total) by mouth 4 (four) times daily -  with meals and at bedtime. 10/25/21 11/24/21  Nafziger, Tommi Rumps, NP  topiramate (TOPAMAX) 50 MG tablet TAKE 1/2 TABLET BY MOUTH TWICE DAILY FOR 7 DAYS, THEN 1 TABLET TWICE DAILY FOR 7 DAYS, THEN 2 TABLETS TWICE DAILY 05/12/22   Farrel Conners, MD      Allergies    Patient has  no known allergies.    Review of Systems   Review of Systems  Respiratory:  Positive for shortness of breath. Negative for cough.   Cardiovascular:  Positive for chest pain.    Physical Exam Updated Vital Signs BP (!) 126/95   Pulse 77   Temp 98.5 F (36.9 C) (Oral)   Resp 12   Ht '6\' 1"'$  (1.854 m)   Wt 74.8 kg   SpO2 98%   BMI 21.77 kg/m  Physical Exam Vitals and nursing note reviewed.  Constitutional:      General: He is not in acute distress.    Appearance: He is well-developed.  HENT:     Head: Normocephalic and atraumatic.  Eyes:     Conjunctiva/sclera: Conjunctivae normal.  Cardiovascular:     Rate and Rhythm: Normal rate and regular rhythm.     Heart sounds: No murmur heard. Pulmonary:     Effort: Pulmonary effort is normal. No respiratory distress.     Breath sounds: Normal breath sounds.  Chest:     Comments: +TTP under left breast Abdominal:     Palpations: Abdomen is soft.     Tenderness: There is no abdominal tenderness.  Musculoskeletal:        General: No swelling.     Cervical  back: Neck supple.  Skin:    General: Skin is warm and dry.     Capillary Refill: Capillary refill takes less than 2 seconds.  Neurological:     Mental Status: He is alert.  Psychiatric:        Mood and Affect: Mood normal.     ED Results / Procedures / Treatments   Labs (all labs ordered are listed, but only abnormal results are displayed) Labs Reviewed  CBC - Abnormal; Notable for the following components:      Result Value   WBC 11.3 (*)    All other components within normal limits  BASIC METABOLIC PANEL  D-DIMER, QUANTITATIVE (NOT AT [facility])  TROPONIN I (HIGH SENSITIVITY)  TROPONIN I (HIGH SENSITIVITY)    EKG None  Radiology DG Chest 2 View  Result Date: 10/13/2022 CLINICAL DATA:  Chest pain and rib pain for 3 days. No reported history of injury EXAM: CHEST - 2 VIEW COMPARISON:  2014 December FINDINGS: The heart size and mediastinal contours are within  normal limits. Both lungs are clear. The visualized skeletal structures are unremarkable. IMPRESSION: No active cardiopulmonary disease. Electronically Signed   By: Jill Side M.D.   On: 10/13/2022 12:26    Procedures Procedures    Medications Ordered in ED Medications  lidocaine (LIDODERM) 5 % 1 patch (1 patch Transdermal Patch Applied 10/13/22 1448)  ketorolac (TORADOL) 15 MG/ML injection 15 mg (15 mg Intravenous Given 10/13/22 1449)    ED Course/ Medical Decision Making/ A&P             HEART Score: 1                Medical Decision Making Patient is a 47 year old male, here for chest pain has been going on for since Saturday, he has a Administrator, [employer], and picks up heavy pieces of steel, however denies any recent coughing.  We will obtain D-dimer secondary to low risk [place].  Additionally heart score is 1.  Troponins, chest x-ray ordered.  Amount and/or Complexity of Data Reviewed Labs: ordered.    Details: D-dimer less than 0.27, normal troponin. Radiology: ordered.    Details: Chest x-ray clear ECG/medicine tests:  Decision-making details documented in ED Course. Discussion of management or test interpretation with external provider(s): Discussed with patient, normal D-dimer, normal troponin, chest x-ray clear.  Pain improved with lidocaine patch, we discussed likely secondary to muscle strain, return precautions emphasized  Risk OTC drugs. Prescription drug management.   Final Clinical Impression(s) / ED Diagnoses Final diagnoses:  Chest pain, unspecified type  Muscle strain    Rx / DC Orders ED Discharge Orders          Ordered    lidocaine (HM LIDOCAINE PATCH) 4 %  Every 24 hours        10/13/22 1729              Dula Havlik, [place], [place] 10/13/22 1732    Pattricia Boss, MD 10/20/22 1433

## 2022-10-13 NOTE — ED Triage Notes (Signed)
Pt c/o CP and SHOB since Saturday. Sitting up increases pain per pt. Sharp pain under left breast

## 2023-02-16 ENCOUNTER — Telehealth: Payer: Self-pay | Admitting: Physical Medicine and Rehabilitation

## 2023-02-16 NOTE — Telephone Encounter (Signed)
Patient called needing to schedule an appointment with Dr. Alvester Morin for an injection in his back. The number to contact patient is [phone]

## 2023-02-18 ENCOUNTER — Telehealth: Payer: Self-pay | Admitting: Physical Medicine and Rehabilitation

## 2023-02-18 NOTE — Telephone Encounter (Signed)
LVM to return call to schedule OV. Patient has not been seen since 2022

## 2023-02-18 NOTE — Telephone Encounter (Signed)
Patient called needing to schedule an appointment with Dr. Alvester Morin for his back. The number to contact patient is [phone]

## 2023-02-19 NOTE — Telephone Encounter (Signed)
Spoke with patient and scheduled OV for 02/25/23.  

## 2023-02-25 ENCOUNTER — Encounter: Payer: Self-pay | Admitting: Physical Medicine and Rehabilitation

## 2023-02-25 ENCOUNTER — Ambulatory Visit: Payer: 59 | Admitting: Physical Medicine and Rehabilitation

## 2023-02-25 DIAGNOSIS — M4306 Spondylolysis, lumbar region: Secondary | ICD-10-CM

## 2023-02-25 DIAGNOSIS — M5116 Intervertebral disc disorders with radiculopathy, lumbar region: Secondary | ICD-10-CM

## 2023-02-25 DIAGNOSIS — G8929 Other chronic pain: Secondary | ICD-10-CM | POA: Diagnosis not present

## 2023-02-25 DIAGNOSIS — M5416 Radiculopathy, lumbar region: Secondary | ICD-10-CM

## 2023-02-25 DIAGNOSIS — M47816 Spondylosis without myelopathy or radiculopathy, lumbar region: Secondary | ICD-10-CM

## 2023-02-25 MED ORDER — MELOXICAM 15 MG PO TABS: 15.0000 mg | ORAL_TABLET | Freq: Every day | ORAL | 0 refills | Status: DC

## 2023-02-25 NOTE — Progress Notes (Signed)
Functional Pain Scale - descriptive words and definitions  Moderate (4)   Constantly aware of pain, can complete ADLs with modification/sleep marginally affected at times/passive distraction is of no use, but active distraction gives some relief. Moderate range order  Average Pain 2-3  Lower back pain across the back

## 2023-02-25 NOTE — Progress Notes (Signed)
Ralph Lang - 47 y.o. male MRN [id number]  Date of birth: Feb 26, 1976  Office Visit Note: Visit Date: 02/25/2023 PCP: Karie Georges, MD Referred by: Karie Georges, MD  Subjective: Chief Complaint  Patient presents with   Lower Back - Pain   HPI: Ralph Lang is a 47 y.o. male who comes in today for evaluation of chronic, worsening and severe left sided lower back pain radiating to buttock, hip and down posterior leg to heel. Pain ongoing for several years, worsened over the last several weeks. His pain worsens with movement and activity, describes as sore and aching sensation, currently rates as 7 out of 10. Some relief of pain with home exercise regimen, rest and use of medications. Some relief of pain with Ibuprofen and topical pain patches. Lumbar MRI from 2020 exhibits unilateral pars defect on the right at L5 with a very slight anterolisthesis, shallow residual disc protrusions at L4-5 and L5-S1, also mild facet disease, left greater than right at L5-S1. Patient underwent left L5-S1 interlaminar epidural steroid injection in our office in 2022, significant and sustained relief of pain with this procedure for over a year. Historically, patient has done well with infrequent lumbar epidural steroid injections. He is currently Solicitor at [employer]. Patient denies focal weakness, numbness and tingling. No recent trauma or falls.    Review of Systems  Musculoskeletal:  Positive for back pain.  Neurological:  Negative for tingling, sensory change, focal weakness and weakness.  All other systems reviewed and are negative.  Otherwise per HPI.  Assessment & Plan: Visit Diagnoses:    ICD-10-CM   1. Chronic left-sided low back pain with left-sided sciatica  M54.42    G89.29     2. Lumbar radiculopathy  M54.16 DG INJECT DIAG/THERA/INC NEEDLE/CATH/PLC EPI/LUMB/SAC W/IMG    3. Intervertebral disc disorders with radiculopathy, lumbar region  M51.16      4. Facet arthropathy, lumbar  M47.816     5. Pars defect of lumbar spine  M43.06        Plan: Findings:  Chronic, worsening and severe left sided lower back pain radiating to buttock, hip and down posterior leg to heel. Patient continues to have severe pain despite good conservative therapy such as home exercise regimen, rest and use of medications.  Patient's clinical presentation and exam are consistent with S1 nerve pattern.  There is a shallow residual central and slightly left paracentral disc protrusion at L5-S1.  Next step is to perform diagnostic and hopefully therapeutic left L5-S1 interlaminar epidural steroid injection under fluoroscopic guidance.  Patient is not currently taking anticoagulant medication.  Patient informed me that he is planning to go out of town next week for vacation and would like to have injection as soon as possible.  I did place order for injection to be performed at [facility].  I also discussed medication management with him today and prescribe short course of meloxicam.  If his symptoms persist or minimal relief of pain with injection we discussed the possibility of obtaining new lumbar MRI imaging.  I encouraged patient to remain active as tolerated.  He has no questions at this time.  No red flag symptoms noted upon exam today.     Meds & Orders:  Meds ordered this encounter  Medications   meloxicam (MOBIC) 15 MG tablet    Sig: Take 1 tablet (15 mg total) by mouth daily.    Dispense:  30 tablet    Refill:  0  Orders Placed This Encounter  Procedures   DG INJECT DIAG/THERA/INC NEEDLE/CATH/PLC EPI/LUMB/SAC W/IMG    Follow-up: Return if symptoms worsen or fail to improve.   Procedures: No procedures performed      Clinical History: EXAM: MRI LUMBAR SPINE WITHOUT CONTRAST   TECHNIQUE: Multiplanar, multisequence MR imaging of the lumbar spine was performed. No intravenous contrast was administered.   COMPARISON:  11/03/2010 MRI    FINDINGS: Segmentation: There are five lumbar type vertebral bodies. The last full intervertebral disc space is labeled L5-S1. This correlates with the prior MRI.   Alignment: Very slight anterolisthesis of L5. Unilateral pars defect noted on the right. The other lumbar vertebral bodies are normally aligned.   Vertebrae: Normal marrow signal. No bone lesions or fractures. Mild endplate reactive changes at T12-L1 and L5-S1.   Conus medullaris and cauda equina: Conus extends to the L1 level. Conus and cauda equina appear normal.   Paraspinal and other soft tissues: No significant paraspinal or retroperitoneal findings.   Disc levels:   T12-L1: No significant findings.   L1-2: No significant findings.   L2-3: No significant findings.   L3-4: No significant findings.   L4-5: Slight bulging uncovered disc along with a shallow residual disc protrusion but no neural compression. The canal is fairly generous. No lateral recess or foraminal stenosis.   L5-S1: Slightly progressive degenerative disc disease at this level with disc desiccation, disc space narrowing and endplate reactive changes. The sizable disc protrusion seen on the prior study demonstrates desiccation and retraction. There is a shallow residual central and slightly left paracentral disc protrusion but the spinal canal is fairly generous in is no significant spinal, lateral recess or foraminal stenosis. Mild facet disease, left greater than right.   IMPRESSION: 1. Interval desiccation and retraction of disc protrusions at L4-5 and L5-S1. Residual shallow disc protrusions but no significant neural compression. 2. The other intervertebral disc spaces are unremarkable and stable. 3. Unilateral pars defect on the right at L5 with a very slight anterolisthesis. 4. Progressive degenerative disc disease at L5-S1.     Electronically Signed   By: Rudie Meyer M.D.   On: 05/05/2019 14:47   He reports that he has  been smoking cigarettes. He has been smoking an average of 1 pack per day. He has never used smokeless tobacco. No results for input(s): "HGBA1C", "LABURIC" in the last 8760 hours.  Objective:  VS:  HT:    WT:   BMI:     BP:   HR: bpm  TEMP: ( )  RESP:  Physical Exam Vitals and nursing note reviewed.  HENT:     Head: Normocephalic and atraumatic.     Right Ear: External ear normal.     Left Ear: External ear normal.     Nose: Nose normal.     Mouth/Throat:     Mouth: Mucous membranes are moist.  Eyes:     Extraocular Movements: Extraocular movements intact.  Cardiovascular:     Rate and Rhythm: Normal rate.     Pulses: Normal pulses.  Pulmonary:     Effort: Pulmonary effort is normal.  Abdominal:     General: Abdomen is flat. There is no distension.  Musculoskeletal:        General: Tenderness present.     Cervical back: Normal range of motion.     Comments: Patient rises from seated position to standing without difficulty. Good lumbar range of motion. No pain noted with facet loading. 5/5 strength noted with bilateral  hip flexion, knee flexion/extension, ankle dorsiflexion/plantarflexion and EHL. No clonus noted bilaterally. No pain upon palpation of greater trochanters. No pain with internal/external rotation of bilateral hips. Sensation intact bilaterally. Dysesthesias noted to left S1 dermatome. Negative slump test bilaterally. Ambulates without aid, gait steady.     Skin:    General: Skin is warm and dry.     Capillary Refill: Capillary refill takes less than 2 seconds.  Neurological:     General: No focal deficit present.     Mental Status: He is alert and oriented to person, place, and time.  Psychiatric:        Mood and Affect: Mood normal.        Behavior: Behavior normal.     Ortho Exam  Imaging: No results found.  Past Medical/Family/Surgical/Social History: Medications & Allergies reviewed per EMR, new medications updated. Patient Active Problem List    Diagnosis Date Noted   GERD without esophagitis 05/15/2022   Tinea pedis, left 05/09/2022   Foreign body in foot, left 05/09/2022   Protrusion of lumbar intervertebral disc 04/12/2019   Genital warts 07/02/2018   Migraine 07/02/2018   Midepigastric pain 06/14/2018   Unintentional weight loss 06/14/2018   Early satiety 06/14/2018   Food aversion 06/14/2018   Generalized anxiety disorder 07/26/2013   Smoker 07/26/2013   Past Medical History:  Diagnosis Date   Anxiety    GERD (gastroesophageal reflux disease)    Kidney stone    Medial epicondylitis of elbow, left 04/12/2019   Migraine    Panic attack    Family History  Problem Relation Age of Onset   Hypertension Mother    Migraines Mother    Hypertension Father    Migraines Brother    Migraines Maternal Grandmother    Alzheimer's disease Maternal Grandmother    Colon cancer Paternal Uncle    Esophageal cancer Neg Hx    Inflammatory bowel disease Neg Hx    Liver disease Neg Hx    Pancreatic cancer Neg Hx    Rectal cancer Neg Hx    Stomach cancer Neg Hx    Past Surgical History:  Procedure Laterality Date   APPENDECTOMY     COLONOSCOPY WITH ESOPHAGOGASTRODUODENOSCOPY (EGD)  06/30/2018   CYST REMOVAL HAND     CYST REMOVAL NECK     LACERATION REPAIR Right    wrist   Social History   Occupational History   Occupation: Solicitor  Tobacco Use   Smoking status: Every Day    Packs/day: 1    Types: Cigarettes   Smokeless tobacco: Never  Vaping Use   Vaping Use: Former  Substance and Sexual Activity   Alcohol use: Yes    Comment: once a month   Drug use: Yes    Types: Marijuana    Comment: last had 3-4 weeks ago   Sexual activity: Yes    Partners: Female

## 2023-03-06 ENCOUNTER — Ambulatory Visit
Admission: RE | Admit: 2023-03-06 | Discharge: 2023-03-06 | Disposition: A | Discharge status: Home / Self Care | Payer: 59 | Source: Ambulatory Visit | Attending: Physical Medicine and Rehabilitation | Admitting: Physical Medicine and Rehabilitation

## 2023-03-06 DIAGNOSIS — M5416 Radiculopathy, lumbar region: Secondary | ICD-10-CM

## 2023-03-06 MED ORDER — METHYLPREDNISOLONE ACETATE 40 MG/ML INJ SUSP (RADIOLOG
80.0000 mg | Freq: Once | INTRAMUSCULAR | Status: AC
  Administered 2023-03-06: 80 mg via EPIDURAL

## 2023-03-06 MED ORDER — IOPAMIDOL (ISOVUE-M 200) INJECTION 41%
1.0000 mL | Freq: Once | INTRAMUSCULAR | Status: AC
  Administered 2023-03-06: 1 mL via EPIDURAL

## 2023-03-06 NOTE — Discharge Instructions (Signed)

## 2023-03-23 ENCOUNTER — Ambulatory Visit: Payer: 59

## 2023-03-23 ENCOUNTER — Ambulatory Visit (INDEPENDENT_AMBULATORY_CARE_PROVIDER_SITE_OTHER): Payer: 59 | Admitting: Family Medicine

## 2023-03-23 ENCOUNTER — Ambulatory Visit (INDEPENDENT_AMBULATORY_CARE_PROVIDER_SITE_OTHER): Payer: 59

## 2023-03-23 ENCOUNTER — Encounter: Payer: Self-pay | Admitting: Family Medicine

## 2023-03-23 VITALS — BP 104/80 | HR 86 | Temp 98.6°F | Ht 73.0 in | Wt 168.8 lb

## 2023-03-23 DIAGNOSIS — K219 Gastro-esophageal reflux disease without esophagitis: Secondary | ICD-10-CM | POA: Diagnosis not present

## 2023-03-23 DIAGNOSIS — S93491A Sprain of other ligament of right ankle, initial encounter: Secondary | ICD-10-CM | POA: Diagnosis not present

## 2023-03-23 MED ORDER — DEXLANSOPRAZOLE 60 MG PO CPDR: 60.0000 mg | DELAYED_RELEASE_CAPSULE | Freq: Every day | ORAL | 2 refills | Status: DC

## 2023-03-23 NOTE — Progress Notes (Unsigned)
   Acute Office Visit  Subjective:     Patient ID: Ralph Lang, male    DOB: 02-08-1976, 47 y.o.   MRN: [id number]  Chief Complaint  Patient presents with   Ankle Injury    Patient states he was loading items into his truck, stepped down from the back and into a hole, states right ankle twisted and felt a pop 2 days ago, tried Ibuprofen with no relief    Ankle Injury    Patient is in today for acute injury to his right ankle. States when he stepped off his truck his right ankle rolled inwards and he fell to the ground. States that he straightened his foot and then felt like a pop.   Review of Systems  All other systems reviewed and are negative.       Objective:    BP 104/80 (BP Location: Left Arm, Patient Position: Sitting, Cuff Size: Normal)   Pulse 86   Temp 98.6 F (37 C) (Oral)   Ht 6\' 1"  (1.854 m)   Wt 168 lb 12.8 oz (76.6 kg)   SpO2 98%   BMI 22.27 kg/m  {Vitals History (Optional):23777}  Physical Exam Constitutional:      Appearance: Normal appearance. He is normal weight.  Neurological:     Mental Status: He is alert.     No results found for any visits on 03/23/23.      Assessment & Plan:   Problem List Items Addressed This Visit       Unprioritized   GERD without esophagitis   Relevant Medications   dexlansoprazole (DEXILANT) 60 MG capsule   Other Relevant Orders   Ambulatory referral to Gastroenterology   Other Visit Diagnoses     Sprain of other ligament of right ankle, initial encounter    -  Primary   Relevant Orders   DG Knee 1-2 Views Right   DG Foot Complete Right      There is bruising on the medial side of the ankle and swelling on the lateral side with exquisite tenderness of the later 4th and 5th metatarsal area. Concern for lateral ligament sprain/ damage. I have ordered x-rays to evaluate and pt was advised to continue NSAIDS and compression therapy, further orders TBD depending on the results of his x-rays. Meds  ordered this encounter  Medications   dexlansoprazole (DEXILANT) 60 MG capsule    Sig: Take 1 capsule (60 mg total) by mouth daily.    Dispense:  30 capsule    Refill:  2    Failed protonix therapy    No follow-ups on file.  Karie Georges, MD

## 2023-03-30 ENCOUNTER — Other Ambulatory Visit: Payer: Self-pay | Admitting: Physical Medicine and Rehabilitation

## 2023-05-15 ENCOUNTER — Ambulatory Visit (INDEPENDENT_AMBULATORY_CARE_PROVIDER_SITE_OTHER): Payer: 59 | Admitting: Adult Health

## 2023-05-15 ENCOUNTER — Encounter: Payer: Self-pay | Admitting: Adult Health

## 2023-05-15 DIAGNOSIS — G43909 Migraine, unspecified, not intractable, without status migrainosus: Secondary | ICD-10-CM

## 2023-05-15 MED ORDER — KETOROLAC TROMETHAMINE 60 MG/2ML IM SOLN
60.0000 mg | Freq: Once | INTRAMUSCULAR | Status: AC
  Administered 2023-05-15: 60 mg via INTRAMUSCULAR

## 2023-05-15 MED ORDER — METHYLPREDNISOLONE ACETATE 80 MG/ML IJ SUSP
80.0000 mg | Freq: Once | INTRAMUSCULAR | Status: AC
  Administered 2023-05-15: 80 mg via INTRAMUSCULAR

## 2023-05-15 MED ORDER — RIZATRIPTAN BENZOATE 10 MG PO TABS: 10.0000 mg | ORAL_TABLET | ORAL | 0 refills | Status: DC | PRN

## 2023-05-15 NOTE — Progress Notes (Signed)
Subjective:    Patient ID: Ralph Lang, male    DOB: Jul 29, 1976, 47 y.o.   MRN: [id number]  Headache    47 year old male who  has a past medical history of Anxiety, GERD (gastroesophageal reflux disease), Kidney stone, Medial epicondylitis of elbow, left (04/12/2019), Migraine, and Panic attack.  He is a patient of Dr. Casimiro Needle who I am seeing today for headache that has been present for three weeks. He has a history of migraine headache but never had one that lasted this long. He reports that this migraine headaches presents like others he has had in the past. He has blurry vision out of his left eye, left sided front headache that radiates to his neck and arms. He has had nausea and vomiting that is not uncommon for him during migraines.   + photo ad phonophobia  He has not been using anything to help with his migraines. He is out of Maxalt.   Reports that he can have a migraine weekly or multiple migraines weekly.    Review of Systems  Neurological:  Positive for headaches.   See HPI   Past Medical History:  Diagnosis Date   Anxiety    GERD (gastroesophageal reflux disease)    Kidney stone    Medial epicondylitis of elbow, left 04/12/2019   Migraine    Panic attack     Social History   Socioeconomic History   Marital status: Married    Spouse name: Not on file   Number of children: 1   Years of education: Not on file   Highest education level: Not on file  Occupational History   Occupation: Solicitor  Tobacco Use   Smoking status: Every Day    Current packs/day: 1.00    Types: Cigarettes   Smokeless tobacco: Never  Vaping Use   Vaping status: Former  Substance and Sexual Activity   Alcohol use: Yes    Comment: once a month   Drug use: Yes    Types: Marijuana    Comment: last had 3-4 weeks ago   Sexual activity: Yes    Partners: Female  Other Topics Concern   Not on file  Social History Narrative   Not on file   Social Determinants of  Health   Financial Resource Strain: Not on file  Food Insecurity: Not on file  Transportation Needs: Not on file  Physical Activity: Not on file  Stress: Not on file  Social Connections: Not on file  Intimate Partner Violence: Not on file    Past Surgical History:  Procedure Laterality Date   APPENDECTOMY     COLONOSCOPY WITH ESOPHAGOGASTRODUODENOSCOPY (EGD)  06/30/2018   CYST REMOVAL HAND     CYST REMOVAL NECK     LACERATION REPAIR Right    wrist    Family History  Problem Relation Age of Onset   Hypertension Mother    Migraines Mother    Hypertension Father    Migraines Brother    Migraines Maternal Grandmother    Alzheimer's disease Maternal Grandmother    Colon cancer Paternal Uncle    Esophageal cancer Neg Hx    Inflammatory bowel disease Neg Hx    Liver disease Neg Hx    Pancreatic cancer Neg Hx    Rectal cancer Neg Hx    Stomach cancer Neg Hx     No Known Allergies  Current Outpatient Medications on File Prior to Visit  Medication Sig Dispense Refill   alprazolam Prudy Feeler)  2 MG tablet Take 2 mg by mouth 2 (two) times daily.     dexlansoprazole (DEXILANT) 60 MG capsule Take 1 capsule (60 mg total) by mouth daily. 30 capsule 2   meloxicam (MOBIC) 15 MG tablet TAKE 1 TABLET(15 MG) BY MOUTH DAILY 30 tablet 0   SUMATRIPTAN SUCCINATE PO Take by mouth as needed.     No current facility-administered medications on file prior to visit.    BP (!) 138/90   Pulse 100   Temp 97.7 F (36.5 C) (Oral)   Ht 6\' 1"  (1.854 m)   Wt 168 lb (76.2 kg)   SpO2 98%   BMI 22.16 kg/m       Objective:   Physical Exam Vitals and nursing note reviewed.  Constitutional:      Appearance: Normal appearance.  HENT:     Right Ear: Tympanic membrane, ear canal and external ear normal.     Left Ear: Tympanic membrane, ear canal and external ear normal.  Eyes:     Extraocular Movements: Extraocular movements intact.     Conjunctiva/sclera: Conjunctivae normal.     Pupils: Pupils  are equal, round, and reactive to light.  Cardiovascular:     Rate and Rhythm: Normal rate and regular rhythm.     Pulses: Normal pulses.     Heart sounds: Normal heart sounds.  Pulmonary:     Effort: Pulmonary effort is normal.     Breath sounds: Normal breath sounds.  Musculoskeletal:        General: Normal range of motion.  Skin:    General: Skin is warm and dry.  Neurological:     General: No focal deficit present.     Mental Status: He is oriented to person, place, and time.  Psychiatric:        Mood and Affect: Mood normal.        Behavior: Behavior normal.        Thought Content: Thought content normal.        Judgment: Judgment normal.       Assessment & Plan:  1. Migraine without status migrainosus, not intractable, unspecified migraine type - Will give Toradol and Depo medrol injection in the office today. Refill Maxalt.  - Advised follow up with PCP for further management as it sounds like he could benefit from daily therapy to help prevent migraines.  - rizatriptan (MAXALT) 10 MG tablet; Take 1 tablet (10 mg total) by mouth as needed for migraine. May repeat in 2 hours if needed  Dispense: 10 tablet; Refill: 0 - ketorolac (TORADOL) injection 60 mg - methylPREDNISolone acetate (DEPO-MEDROL) injection 80 mg   Time spent with patient today was 31 minutes which consisted of chart review, discussing migraine headaches and medication management. work up, treatment answering questions and documentation.

## 2023-05-15 NOTE — Patient Instructions (Signed)
It was great seeing you today and I am sorry you are feeling so bad.   In the office today we gave you an injection of Toradol and Depo Medrol to help abort this migraine headache.   I also refilled your Maxalt that you can take as needed when you feel a migraine coming on   Please follow up with Dr. Casimiro Needle to discuss daily therapy to prevent migraine headaches.

## 2023-05-22 ENCOUNTER — Ambulatory Visit: Payer: 59 | Admitting: Adult Health

## 2023-05-22 ENCOUNTER — Ambulatory Visit: Payer: 59 | Admitting: Family Medicine

## 2023-05-22 ENCOUNTER — Encounter: Payer: Self-pay | Admitting: Adult Health

## 2023-05-22 VITALS — BP 130/98 | HR 90 | Temp 97.6°F | Ht 73.0 in | Wt 167.0 lb

## 2023-05-22 DIAGNOSIS — G43909 Migraine, unspecified, not intractable, without status migrainosus: Secondary | ICD-10-CM | POA: Diagnosis not present

## 2023-05-22 MED ORDER — CYCLOBENZAPRINE HCL 10 MG PO TABS: 10.0000 mg | ORAL_TABLET | Freq: Every day | ORAL | 0 refills | Status: DC

## 2023-05-22 MED ORDER — SUMATRIPTAN SUCCINATE 100 MG PO TABS: 100.0000 mg | ORAL_TABLET | ORAL | 1 refills | Status: DC | PRN

## 2023-05-22 MED ORDER — TOPIRAMATE 50 MG PO TABS: 25.0000 mg | ORAL_TABLET | Freq: Every day | ORAL | 0 refills | Status: DC

## 2023-05-22 NOTE — Progress Notes (Signed)
Subjective:    Patient ID: Ralph Lang, male    DOB: 09-Aug-1976, 47 y.o.   MRN: [id number]  HPI 47 year old male who  has a past medical history of Anxiety, GERD (gastroesophageal reflux disease), Kidney stone, Medial epicondylitis of elbow, left (04/12/2019), Migraine, and Panic attack.  He is a patient of Dr. Casimiro Needle who I am seeing today for follow-up regarding migraine headaches.  He was seen last week for headache that have been present for 3 weeks.  He does have a history of migraine headaches but never had one that lasted that long.  At this time his migraine sounded like others have presented in the past.  He did endorse nausea and vomiting as well as blurry vision out of his left eye and a left-sided frontal headache.  He was positive for photo and phonophobia.  He was out of his triptan so this was refilled and he was also given an injection of Toradol and Depo-Medrol.  He was advised to follow-up with his PCP for further management but could not get in until November  Today he reports that he did not receive any relief from the Toradol and prednisone shot.  He continues to have a headache that has been present since we last saw.  He is using his Imitrex but feels as though this is causing him to  feel more anxious and has not been working well.  Today he reports that the headaches seem to be working upwards from the base of the neck/shoulders.  He feels tight and tender in his upper shoulders. He continues to have blurred vision    Review of Systems See HPI   Past Medical History:  Diagnosis Date   Anxiety    GERD (gastroesophageal reflux disease)    Kidney stone    Medial epicondylitis of elbow, left 04/12/2019   Migraine    Panic attack     Social History   Socioeconomic History   Marital status: Married    Spouse name: Not on file   Number of children: 1   Years of education: Not on file   Highest education level: Not on file  Occupational History    Occupation: Solicitor  Tobacco Use   Smoking status: Every Day    Current packs/day: 1.00    Types: Cigarettes   Smokeless tobacco: Never  Vaping Use   Vaping status: Former  Substance and Sexual Activity   Alcohol use: Yes    Comment: once a month   Drug use: Yes    Types: Marijuana    Comment: last had 3-4 weeks ago   Sexual activity: Yes    Partners: Female  Other Topics Concern   Not on file  Social History Narrative   Not on file   Social Determinants of Health   Financial Resource Strain: Not on file  Food Insecurity: Not on file  Transportation Needs: Not on file  Physical Activity: Not on file  Stress: Not on file  Social Connections: Not on file  Intimate Partner Violence: Not on file    Past Surgical History:  Procedure Laterality Date   APPENDECTOMY     COLONOSCOPY WITH ESOPHAGOGASTRODUODENOSCOPY (EGD)  06/30/2018   CYST REMOVAL HAND     CYST REMOVAL NECK     LACERATION REPAIR Right    wrist    Family History  Problem Relation Age of Onset   Hypertension Mother    Migraines Mother    Hypertension Father  Migraines Brother    Migraines Maternal Grandmother    Alzheimer's disease Maternal Grandmother    Colon cancer Paternal Uncle    Esophageal cancer Neg Hx    Inflammatory bowel disease Neg Hx    Liver disease Neg Hx    Pancreatic cancer Neg Hx    Rectal cancer Neg Hx    Stomach cancer Neg Hx     No Known Allergies  Current Outpatient Medications on File Prior to Visit  Medication Sig Dispense Refill   alprazolam (XANAX) 2 MG tablet Take 2 mg by mouth 2 (two) times daily.     dexlansoprazole (DEXILANT) 60 MG capsule Take 1 capsule (60 mg total) by mouth daily. 30 capsule 2   meloxicam (MOBIC) 15 MG tablet TAKE 1 TABLET(15 MG) BY MOUTH DAILY 30 tablet 0   rizatriptan (MAXALT) 10 MG tablet Take 1 tablet (10 mg total) by mouth as needed for migraine. May repeat in 2 hours if needed 10 tablet 0   SUMATRIPTAN SUCCINATE PO Take by mouth as  needed.     No current facility-administered medications on file prior to visit.    BP (!) 130/98   Pulse 90   Temp 97.6 F (36.4 C) (Oral)   Ht 6\' 1"  (1.854 m)   Wt 167 lb (75.8 kg)   SpO2 100%   BMI 22.03 kg/m       Objective:   Physical Exam Vitals and nursing note reviewed.  Constitutional:      Appearance: Normal appearance. He is ill-appearing.  Cardiovascular:     Rate and Rhythm: Normal rate and regular rhythm.     Pulses: Normal pulses.     Heart sounds: Normal heart sounds.  Pulmonary:     Effort: Pulmonary effort is normal.     Breath sounds: Normal breath sounds.  Musculoskeletal:        General: Normal range of motion.       Arms:  Skin:    General: Skin is warm and dry.  Neurological:     General: No focal deficit present.     Mental Status: He is alert and oriented to person, place, and time.  Psychiatric:        Mood and Affect: Mood normal.        Behavior: Behavior normal.        Thought Content: Thought content normal.        Judgment: Judgment normal.       Assessment & Plan:  1. Migraine without status migrainosus, not intractable, unspecified migraine type -Will send in Flexeril to help with muscular discomfort which may be contributing to his headache.  Will also start on Topamax, order MRI of the brain, referred to headache clinic since his headaches seem to be getting worse - cyclobenzaprine (FLEXERIL) 10 MG tablet; Take 1 tablet (10 mg total) by mouth at bedtime.  Dispense: 7 tablet; Refill: 0 - MR Brain Wo Contrast; Future - topiramate (TOPAMAX) 50 MG tablet; Take 0.5 tablets (25 mg total) by mouth daily.  Dispense: 90 tablet; Refill: 0 - AMB referral to headache clinic - Follow up as needed Samples of Nurtec were given to the patient, quantity 1 , Lot Number [id number]   Shirline Frees, NP  Time spent with patient today was 31 minutes which consisted of chart review, discussing migraine headaches, work up, treatment answering  questions and documentation.

## 2023-05-22 NOTE — Patient Instructions (Signed)
I am going to refer you to the headache clinic   I am also going to refer you to have an MRI of the head  I am going to start you on Topamax daily to prevent migraine headache   I have sent in Flexeril as a muscle relaxer - take this at night

## 2023-06-29 ENCOUNTER — Ambulatory Visit: Payer: 59 | Admitting: Family Medicine

## 2023-06-29 ENCOUNTER — Encounter: Payer: Self-pay | Admitting: Family Medicine

## 2023-06-29 VITALS — BP 120/78 | HR 85 | Temp 98.4°F | Ht 73.0 in | Wt 162.4 lb

## 2023-06-29 DIAGNOSIS — K219 Gastro-esophageal reflux disease without esophagitis: Secondary | ICD-10-CM | POA: Diagnosis not present

## 2023-06-29 DIAGNOSIS — G43909 Migraine, unspecified, not intractable, without status migrainosus: Secondary | ICD-10-CM | POA: Diagnosis not present

## 2023-06-29 DIAGNOSIS — B079 Viral wart, unspecified: Secondary | ICD-10-CM | POA: Diagnosis not present
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
## 2023-06-29 NOTE — Patient Instructions (Addendum)
Prilosec -- Omeprazole 20 mg or 40 mg daily  Nexium -- Esomeprazole 20 mg or 40 mg daily.

## 2023-06-30 NOTE — Assessment & Plan Note (Signed)
Pt is now seeing [facility] and they are now treating his headaches. I did not want to alter any of his medications today as he will be managed by them at this point.

## 2023-06-30 NOTE — Assessment & Plan Note (Signed)
Continues to have severe GERD symptoms, I had placed a referral for him previously but he has not heard anything from the GI office, I will message our referral coordinator to find out what happened.

## 2023-09-29 ENCOUNTER — Encounter: Payer: 59 | Admitting: Family Medicine

## 2023-11-16 IMAGING — DX DG CHEST 2V
3 series · 3 of 3 positions shown · non-contrast
Comparison: 03/07/2016

CLINICAL DATA: Cough

EXAM:
CHEST - 2 VIEW

[chest pa]
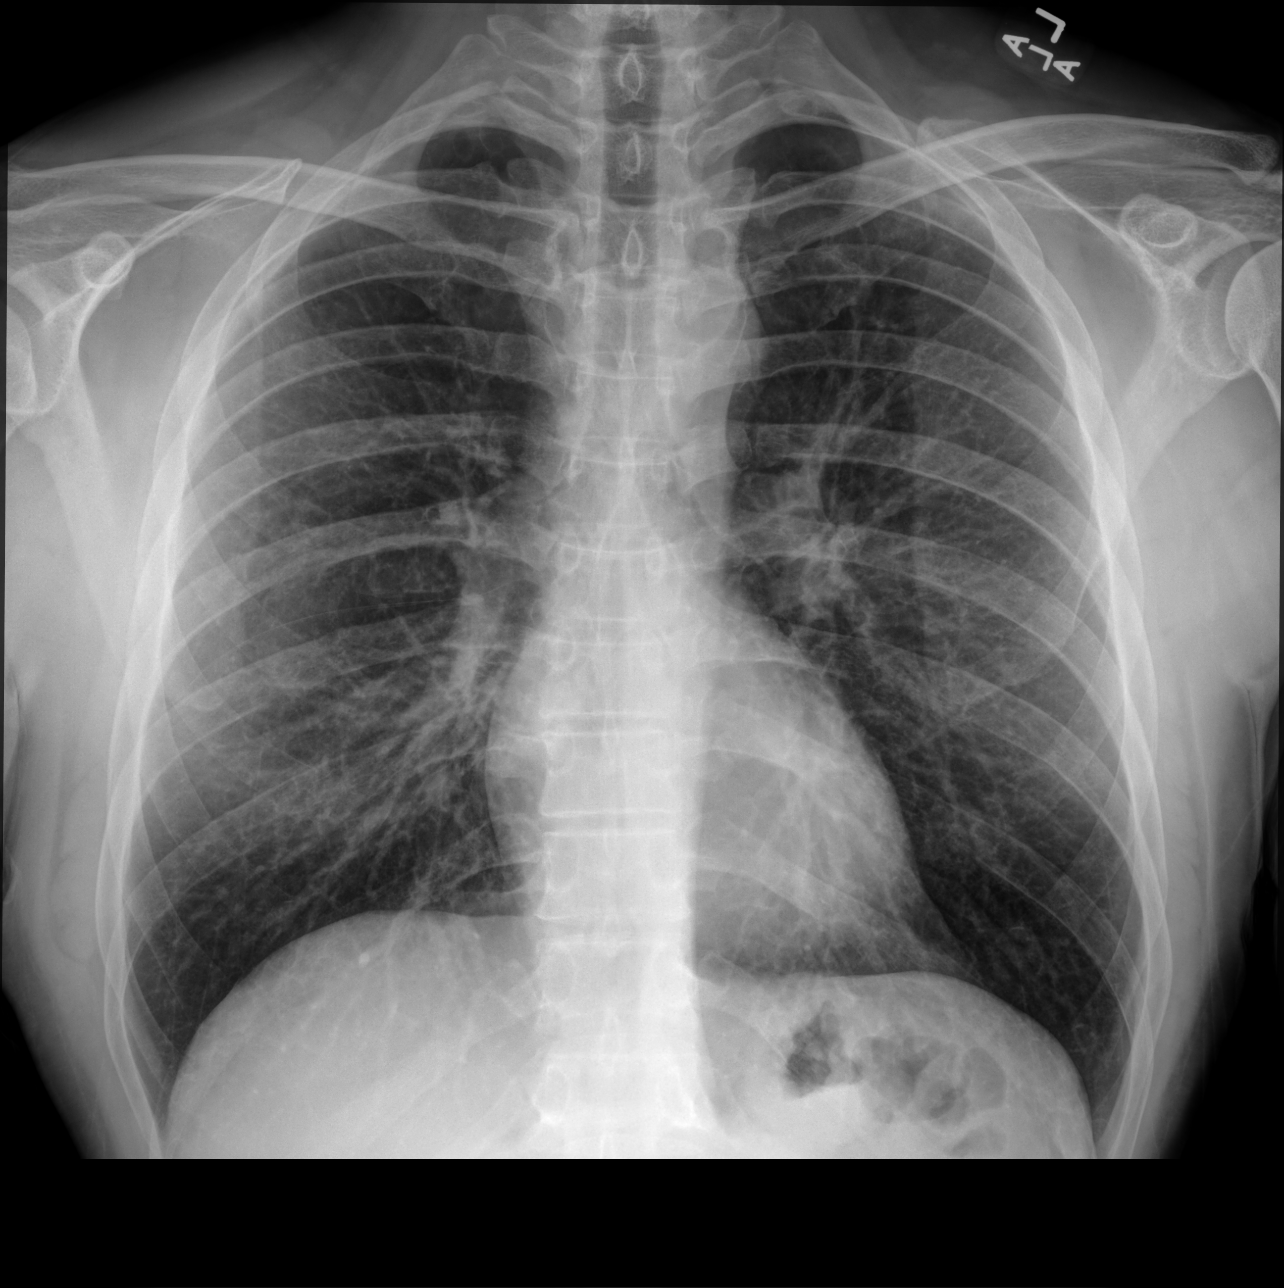

[chest lat (1 of 2)]
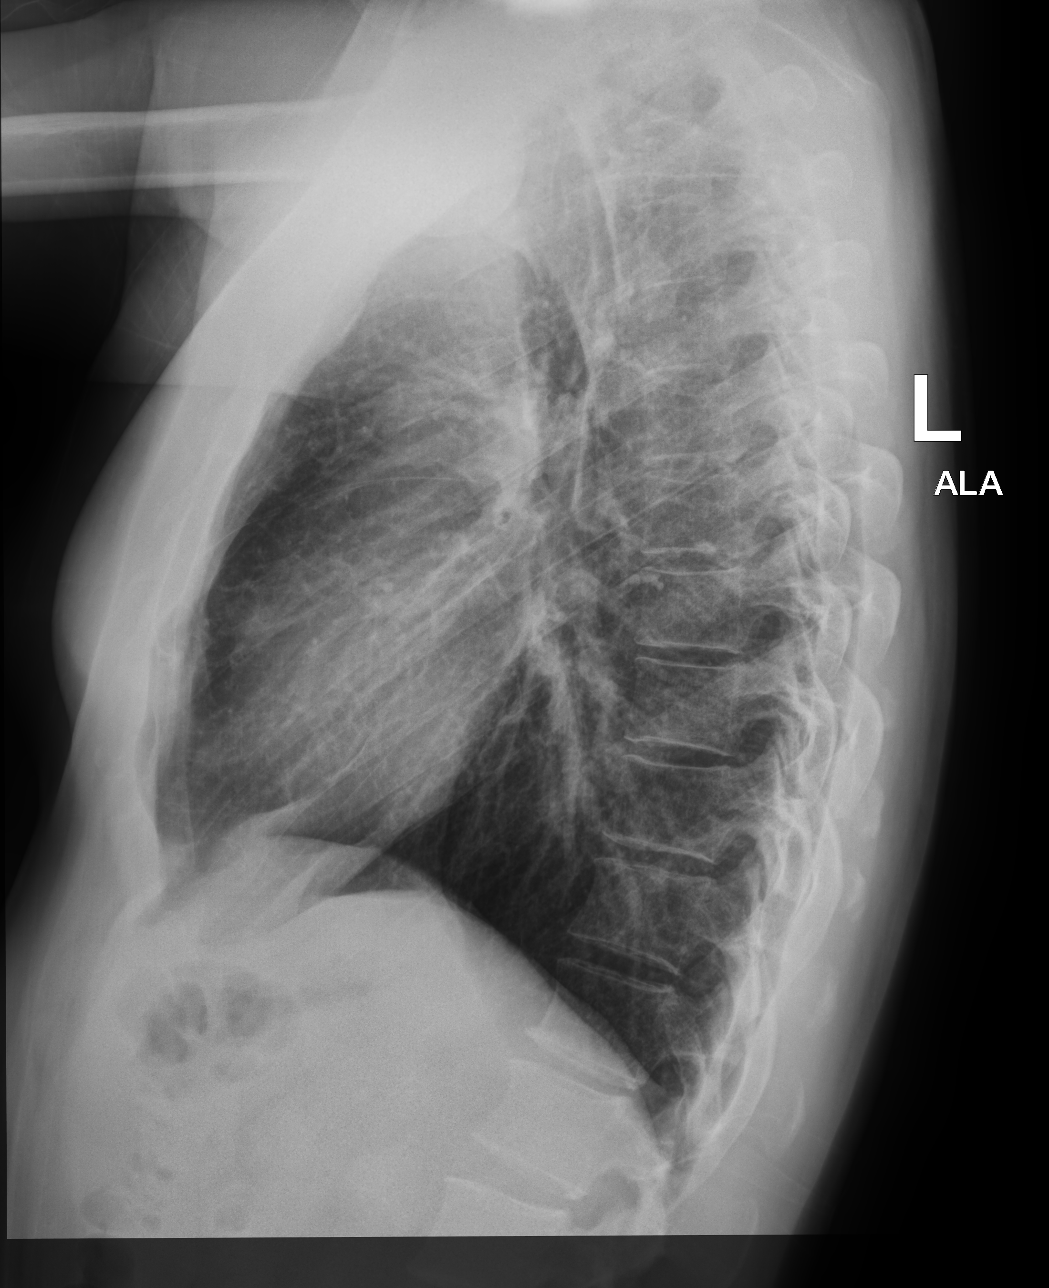

[chest lat (2 of 2)]
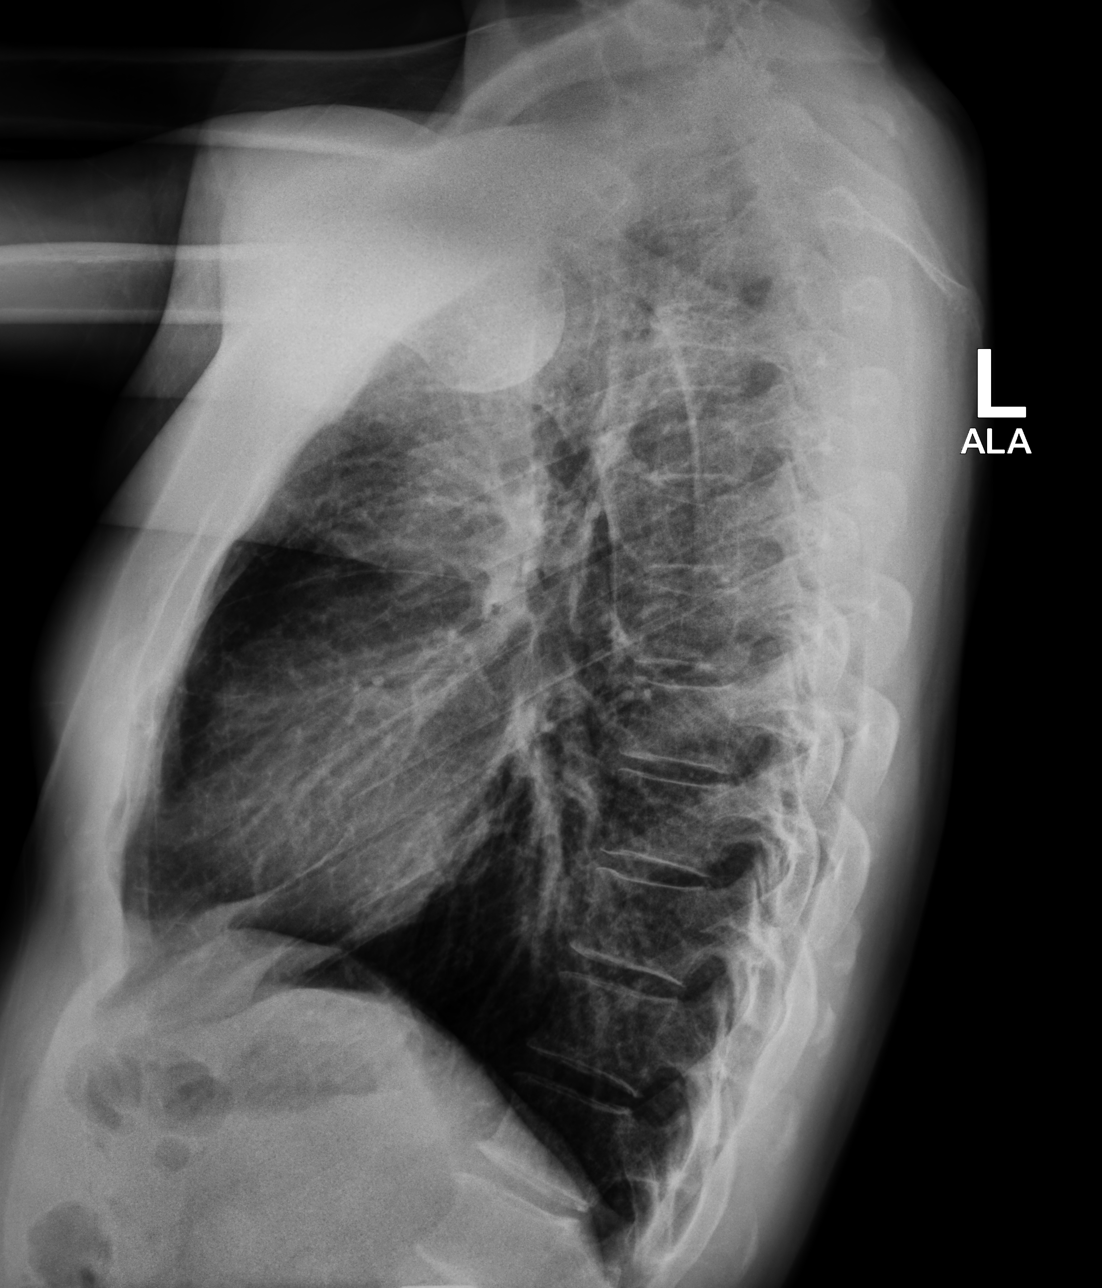

[3 of 3 positions shown; findings below may reference images not displayed]

FINDINGS: Cardiac shadow is within normal limits. The lungs are well aerated
bilaterally. No focal infiltrate or sizable effusion is seen. No
bony abnormality is noted.
IMPRESSION: No acute abnormality seen.
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
## 2024-05-11 ENCOUNTER — Ambulatory Visit: Payer: Self-pay

## 2024-05-11 NOTE — Telephone Encounter (Signed)
 FYI Only or Action Required?: FYI only for provider.  Patient was last seen in primary care on 06/29/2023 by Ozell Heron HERO, MD.  Called Nurse Triage reporting Leg Pain.  Symptoms began several days ago.  Interventions attempted: OTC medications: Ibuprofen.  Symptoms are: gradually worsening.  Triage Disposition: See Physician Within 24 Hours  Patient/caregiver understands and will follow disposition?: Yes  **Appt. Scheduled for 9/18**         Reason for Disposition  Numbness in a leg or foot (i.e., loss of sensation)  Answer Assessment - Initial Assessment Questions 1. ONSET: When did the pain start?      X 2 days ago   2. LOCATION: Where is the pain located?      Right leg, moves to hip  3. PAIN: How bad is the pain?    (Scale 1-10; or mild, moderate, severe)     4/10  4. WORK OR EXERCISE: Has there been any recent work or exercise that involved this part of the body?      No  5. CAUSE: What do you think is causing the leg pain?     Unknown   6. OTHER SYMPTOMS: Do you have any other symptoms? (e.g., chest pain, back pain, breathing difficulty, swelling, rash, fever, numbness, weakness)  He reports numbness in the leg as well. He states he is having some hip discomfort as well, no swelling or redness noted. No warmth to the touch. OTC Ibuprofen for the pain; no relief. Appt. Scheduled for 9/18 to address concerns.  Protocols used: Leg Pain-A-AH

## 2024-05-12 ENCOUNTER — Encounter: Payer: Self-pay | Admitting: Internal Medicine

## 2024-05-12 ENCOUNTER — Ambulatory Visit: Admitting: Internal Medicine

## 2024-05-12 VITALS — BP 130/88 | HR 92 | Temp 98.4°F | Ht 73.0 in | Wt 174.8 lb

## 2024-05-12 DIAGNOSIS — M5410 Radiculopathy, site unspecified: Secondary | ICD-10-CM

## 2024-05-12 DIAGNOSIS — M79604 Pain in right leg: Secondary | ICD-10-CM | POA: Diagnosis not present

## 2024-05-12 MED ORDER — METHYLPREDNISOLONE ACETATE 80 MG/ML IJ SUSP
80.0000 mg | Freq: Once | INTRAMUSCULAR | Status: AC
  Administered 2024-05-12: 80 mg via INTRAMUSCULAR

## 2024-05-12 MED ORDER — METHYLPREDNISOLONE ACETATE 40 MG/ML IJ SUSP
40.0000 mg | Freq: Once | INTRAMUSCULAR | Status: AC
  Administered 2024-05-12: 40 mg via INTRAMUSCULAR

## 2024-05-12 MED ORDER — KETOROLAC TROMETHAMINE 60 MG/2ML IM SOLN
60.0000 mg | Freq: Once | INTRAMUSCULAR | Status: AC
  Administered 2024-05-12: 60 mg via INTRAMUSCULAR
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
## 2024-05-12 NOTE — Telephone Encounter (Signed)
 Appt today with Dr Charlett.

## 2024-05-12 NOTE — Patient Instructions (Addendum)
 Sounds like a nerve inflammation or compression ( less likely shingles)  seems from back and not hip based on location distribution .   Note for work   Toradol   shot is in antiinflammatory in stead of  ibuprofen .  Also Shot of  steroid to hopefully decrease inflammation and thus  nerve pain   Referral to specialty  team  ortho care but you can also call then for  an appt.

## 2024-05-12 NOTE — Telephone Encounter (Signed)
 Noted- ok to close.

## 2024-05-18 ENCOUNTER — Ambulatory Visit: Admitting: Physical Medicine and Rehabilitation

## 2024-05-18 ENCOUNTER — Encounter: Payer: Self-pay | Admitting: Physical Medicine and Rehabilitation

## 2024-05-18 VITALS — BP 162/95 | HR 85

## 2024-05-18 DIAGNOSIS — M25551 Pain in right hip: Secondary | ICD-10-CM

## 2024-05-18 DIAGNOSIS — M5416 Radiculopathy, lumbar region: Secondary | ICD-10-CM

## 2024-05-18 DIAGNOSIS — M79604 Pain in right leg: Secondary | ICD-10-CM

## 2024-05-18 MED ORDER — HYDROCODONE-ACETAMINOPHEN 5-325 MG PO TABS: 1.0000 | ORAL_TABLET | Freq: Three times a day (TID) | ORAL | 0 refills | Status: AC | PRN

## 2024-05-18 NOTE — Progress Notes (Unsigned)
 Ralph Lang - 48 y.o. male MRN [id number]  Date of birth: 1976/07/29  Office Visit Note: Visit Date: 05/18/2024 PCP: Ozell Heron HERO, MD Referred by: Ozell Heron HERO, MD  Subjective: Chief Complaint  Patient presents with   Left Leg - Pain   Right Thigh - Pain   HPI: Ralph Lang is a 48 y.o. male who comes in today for evaluation of acute on chronic right sided hip pain radiating down to anterior thigh. Pain ongoing for about 1 week. We have seen him in the past for more chronic left sided lower back pain with radiculopathy. His pain worsens with standing and twisting. He describes pain as throbbing and pins/needles sensation, currently rates as 5 out of 10. Some relief of pain with home exercise regimen, rest and use of medications. He was recently seen by his PCP, given intramuscular injections of Toradol  and Depo-Medrol  with short term relief of pain. Lumbar MRI imaging from 2020 shows  interval desiccation and retraction of disc protrusions at L4-L5 and L5-S1. Residual shallow disc protrusions but no significant neural compression. There are unilateral pars defect on the right at L5 with a very slight anterolisthesis. He underwent left L5-S1 interlaminar epidural steroid injection at [facility], he reports greater than 80% relief of pain with this procedure. Patient denies focal weakness, numbness and tingling. No recent trauma or falls.        Review of Systems  Neurological:  Negative for tingling, sensory change, focal weakness and weakness.  All other systems reviewed and are negative.  Otherwise per HPI.  Assessment & Plan: Visit Diagnoses:    ICD-10-CM   1. Pain in right hip  M25.551 MR LUMBAR SPINE WO CONTRAST    2. Pain in right leg  M79.604 MR LUMBAR SPINE WO CONTRAST    3. Lumbar radiculopathy  M54.16 MR LUMBAR SPINE WO CONTRAST       Plan: Findings:  Acute on chronic right-sided hip pain radiating to anterior thigh down to knee.   Patient continues to have severe pain despite good conservative therapy such as home exercise regimen, rest and use of medications.  We have seen him in the past for more left-sided back pain however his pain is mostly localized to the right at this time.  His symptoms do seem to be more radicular in nature today.  We discussed treatment plan in detail today.  Given his right sided pain and prior MRI imaging from 2020, I placed order for new lumbar MRI imaging.  Depending on results of MRI imaging we discussed possibility of performing lumbar epidural steroid injection.  Also discussed medication management with him today and prescribed short course of Norco.  His exam today was nonfocal, good strength noted to bilateral lower extremities.  No red flag symptoms noted upon exam today.    Meds & Orders: No orders of the defined types were placed in this encounter.   Orders Placed This Encounter  Procedures   MR LUMBAR SPINE WO CONTRAST    Follow-up: Return for Lumbar MRI review.   Procedures: No procedures performed      Clinical History: EXAM: MRI LUMBAR SPINE WITHOUT CONTRAST   TECHNIQUE: Multiplanar, multisequence MR imaging of the lumbar spine was performed. No intravenous contrast was administered.   COMPARISON:  11/03/2010 MRI   FINDINGS: Segmentation: There are five lumbar type vertebral bodies. The last full intervertebral disc space is labeled L5-S1. This correlates with the prior MRI.   Alignment: Very slight anterolisthesis  of L5. Unilateral pars defect noted on the right. The other lumbar vertebral bodies are normally aligned.   Vertebrae: Normal marrow signal. No bone lesions or fractures. Mild endplate reactive changes at T12-L1 and L5-S1.   Conus medullaris and cauda equina: Conus extends to the L1 level. Conus and cauda equina appear normal.   Paraspinal and other soft tissues: No significant paraspinal or retroperitoneal findings.   Disc levels:   T12-L1:  No significant findings.   L1-2: No significant findings.   L2-3: No significant findings.   L3-4: No significant findings.   L4-5: Slight bulging uncovered disc along with a shallow residual disc protrusion but no neural compression. The canal is fairly generous. No lateral recess or foraminal stenosis.   L5-S1: Slightly progressive degenerative disc disease at this level with disc desiccation, disc space narrowing and endplate reactive changes. The sizable disc protrusion seen on the prior study demonstrates desiccation and retraction. There is a shallow residual central and slightly left paracentral disc protrusion but the spinal canal is fairly generous in is no significant spinal, lateral recess or foraminal stenosis. Mild facet disease, left greater than right.   IMPRESSION: 1. Interval desiccation and retraction of disc protrusions at L4-5 and L5-S1. Residual shallow disc protrusions but no significant neural compression. 2. The other intervertebral disc spaces are unremarkable and stable. 3. Unilateral pars defect on the right at L5 with a very slight anterolisthesis. 4. Progressive degenerative disc disease at L5-S1.     Electronically Signed   By: MYRTIS Stammer M.D.   On: 05/05/2019 14:47   He reports that he has been smoking cigarettes. He has never used smokeless tobacco. No results for input(s): HGBA1C, LABURIC in the last 8760 hours.  Objective:  VS:  HT:    WT:   BMI:     BP:(!) 162/95  HR:85bpm  TEMP: ( )  RESP:  Physical Exam Vitals and nursing note reviewed.  HENT:     Head: Normocephalic and atraumatic.     Right Ear: External ear normal.     Left Ear: External ear normal.     Nose: Nose normal.     Mouth/Throat:     Mouth: Mucous membranes are moist.  Eyes:     Extraocular Movements: Extraocular movements intact.  Cardiovascular:     Rate and Rhythm: Normal rate.     Pulses: Normal pulses.  Pulmonary:     Effort: Pulmonary effort is  normal.  Abdominal:     General: Abdomen is flat. There is no distension.  Musculoskeletal:        General: Tenderness present.     Cervical back: Normal range of motion.     Comments: Patient rises from seated position to standing without difficulty. Good lumbar range of motion. No pain noted with facet loading. 5/5 strength noted with bilateral hip flexion, knee flexion/extension, ankle dorsiflexion/plantarflexion and EHL. No clonus noted bilaterally. No pain upon palpation of greater trochanters. No pain with internal/external rotation of bilateral hips. Sensation intact bilaterally. Negative slump test bilaterally. Ambulates without aid, gait steady.     Skin:    General: Skin is warm and dry.     Capillary Refill: Capillary refill takes less than 2 seconds.  Neurological:     General: No focal deficit present.     Mental Status: He is alert and oriented to person, place, and time.  Psychiatric:        Mood and Affect: Mood normal.  Behavior: Behavior normal.     Ortho Exam  Imaging: No results found.  Past Medical/Family/Surgical/Social History: Medications & Allergies reviewed per EMR, new medications updated. Patient Active Problem List   Diagnosis Date Noted   GERD without esophagitis 05/15/2022   Tinea pedis, left 05/09/2022   Foreign body in foot, left 05/09/2022   Protrusion of lumbar intervertebral disc 04/12/2019   Genital warts 07/02/2018   Migraine 07/02/2018   Midepigastric pain 06/14/2018   Unintentional weight loss 06/14/2018   Early satiety 06/14/2018   Food aversion 06/14/2018   Generalized anxiety disorder 07/26/2013   Smoker 07/26/2013   Past Medical History:  Diagnosis Date   Anxiety    GERD (gastroesophageal reflux disease)    Kidney stone    Medial epicondylitis of elbow, left 04/12/2019   Migraine    Panic attack    Family History  Problem Relation Age of Onset   Hypertension Mother    Migraines Mother    Hypertension Father     Migraines Brother    Migraines Maternal Grandmother    Alzheimer's disease Maternal Grandmother    Colon cancer Paternal Uncle    Esophageal cancer Neg Hx    Inflammatory bowel disease Neg Hx    Liver disease Neg Hx    Pancreatic cancer Neg Hx    Rectal cancer Neg Hx    Stomach cancer Neg Hx    Past Surgical History:  Procedure Laterality Date   APPENDECTOMY     COLONOSCOPY WITH ESOPHAGOGASTRODUODENOSCOPY (EGD)  06/30/2018   CYST REMOVAL HAND     CYST REMOVAL NECK     LACERATION REPAIR Right    wrist   Social History   Occupational History   Occupation: Solicitor  Tobacco Use   Smoking status: Every Day    Current packs/day: 1.00    Types: Cigarettes   Smokeless tobacco: Never  Vaping Use   Vaping status: Former  Substance and Sexual Activity   Alcohol use: Yes    Comment: once a month   Drug use: Yes    Types: Marijuana    Comment: last had 3-4 weeks ago   Sexual activity: Yes    Partners: Female

## 2024-05-18 NOTE — Progress Notes (Unsigned)
 Pain Scale   Average Pain 5   Last injection was 05/2021. He states he usually has left sided back and leg pain. However, now his right hip to knee is hurting. Causing him to have difficulties down daily job. He does have some left leg pain.

## 2024-05-20 ENCOUNTER — Other Ambulatory Visit: Payer: Self-pay | Admitting: Physical Medicine and Rehabilitation

## 2024-05-20 DIAGNOSIS — M25551 Pain in right hip: Secondary | ICD-10-CM

## 2024-05-20 DIAGNOSIS — M5416 Radiculopathy, lumbar region: Secondary | ICD-10-CM

## 2024-05-20 DIAGNOSIS — M79604 Pain in right leg: Secondary | ICD-10-CM

## 2024-05-23 ENCOUNTER — Encounter: Payer: Self-pay | Admitting: Physical Medicine and Rehabilitation

## 2024-05-27 ENCOUNTER — Ambulatory Visit
Admission: RE | Admit: 2024-05-27 | Discharge: 2024-05-27 | Disposition: A | Discharge status: Home / Self Care | Source: Ambulatory Visit | Attending: Physical Medicine and Rehabilitation | Admitting: Physical Medicine and Rehabilitation

## 2024-05-27 DIAGNOSIS — M79604 Pain in right leg: Secondary | ICD-10-CM

## 2024-05-27 DIAGNOSIS — M25551 Pain in right hip: Secondary | ICD-10-CM

## 2024-05-27 DIAGNOSIS — M5416 Radiculopathy, lumbar region: Secondary | ICD-10-CM

## 2024-05-31 ENCOUNTER — Ambulatory Visit
Admission: RE | Admit: 2024-05-31 | Discharge: 2024-05-31 | Disposition: A | Source: Ambulatory Visit | Attending: Physical Medicine and Rehabilitation | Admitting: Physical Medicine and Rehabilitation

## 2024-05-31 DIAGNOSIS — M25551 Pain in right hip: Secondary | ICD-10-CM

## 2024-05-31 DIAGNOSIS — M79604 Pain in right leg: Secondary | ICD-10-CM

## 2024-05-31 DIAGNOSIS — M5416 Radiculopathy, lumbar region: Secondary | ICD-10-CM

## 2024-06-08 ENCOUNTER — Ambulatory Visit (INDEPENDENT_AMBULATORY_CARE_PROVIDER_SITE_OTHER): Admitting: Physical Medicine and Rehabilitation

## 2024-06-08 ENCOUNTER — Encounter: Payer: Self-pay | Admitting: Physical Medicine and Rehabilitation

## 2024-06-08 DIAGNOSIS — M25551 Pain in right hip: Secondary | ICD-10-CM | POA: Diagnosis not present

## 2024-06-08 DIAGNOSIS — M5116 Intervertebral disc disorders with radiculopathy, lumbar region: Secondary | ICD-10-CM | POA: Diagnosis not present

## 2024-06-08 DIAGNOSIS — M79604 Pain in right leg: Secondary | ICD-10-CM | POA: Diagnosis not present

## 2024-06-08 DIAGNOSIS — M5416 Radiculopathy, lumbar region: Secondary | ICD-10-CM

## 2024-06-08 NOTE — Progress Notes (Signed)
 Pain Scale   Average Pain 5 Patient advising he ha lower back pain and is here for MRI Review.        +Driver, -BT, -Dye Allergies.

## 2024-06-08 NOTE — Progress Notes (Signed)
 Ralph Lang - 48 y.o. male MRN [id number]  Date of birth: Jul 25, 1976  Office Visit Note: Visit Date: 06/08/2024 PCP: Ozell Heron HERO, MD Referred by: Ozell Heron HERO, MD  Subjective: Chief Complaint  Patient presents with   Lower Back - Pain   HPI: Ralph Lang is a 48 y.o. male who comes in today for evaluation of acute right sided hip pain radiating down to anterior thigh. He also reports chronic left sided lower back pain radiating down posterior leg. He is here today to discuss recent lumbar MRI imaging. He reports more recent right hip/groin pain within the last month. This right hip pain increases when bearing weight on right leg. No prior issues with right hip. Recent lumbar MRI imaging shows left sided disc protrusion at L4-L5 compressing left L5 nerve root. There is disc bulge at L5-S1 on the left impacting L5 and S1 nerve roots. No high grade spinal canal stenosis. He underwent left L5-S1 interlaminar epidural steroid injection at [facility] on 03/06/2023 that provided greater than 50% relief of pain for 6 or more months. Patient denies focal weakness, numbness and tingling. No recent trauma or falls.      Review of Systems  Musculoskeletal:  Positive for back pain and joint pain.  Neurological:  Negative for tingling, sensory change, focal weakness and weakness.  All other systems reviewed and are negative.  Otherwise per HPI.  Assessment & Plan: Visit Diagnoses:    ICD-10-CM   1. Pain in right hip  M25.551 Ambulatory referral to Orthopedic Surgery    2. Pain in right leg  M79.604 Ambulatory referral to Orthopedic Surgery    3. Lumbar radiculopathy  M54.16 Ambulatory referral to Physical Medicine Rehab    4. Intervertebral disc disorders with radiculopathy, lumbar region  M51.16 Ambulatory referral to Physical Medicine Rehab       Plan: Findings:  1. Acute right hip and groin pain.  Patient continues to have pain despite good conservative  therapy such as home exercise regimen, rest and use of medications.  I discussed recent lumbar MRI with him today using imaging and spine model.  There are left-sided disc protrusions at L4-L5 and L5-S1 that could be a source of his left sided radicular symptoms.  No right sided findings noted.  I do think this newer right sided hip and groin pain is more of an isolated issue and does not seem to correlate with the recent lumbar MRI imaging.  He does have pain upon palpation of right greater trochanter region and with internal/external of his hip today.  I would like to send him to one of our orthopedic specialists for further evaluation of his right hip.  I did place a referral to Bertrum Gaskins, PA today.  We are happy to see him back for a hip injection if he feels this is what he needs.  2. Chronic, worsening and severe left sided lower back pain radiating down posterior leg.  Patient continues to have severe pain despite good conservative therapy such as home exercise regimen, rest and use of medications.  Patient's clinical presentation and exam are consistent with lumbar radiculopathy more of an S1 nerve pattern.  Next step is to perform diagnostic and hopefully therapeutic left L5-S1 interlaminar epidural steroid injection under fluoroscopic guidance.  He is not currently taking anticoagulant medication. If good relief of pain we can repeat this procedure infrequently as needed. No red flag symptoms noted upon exam today.     Meds &  Orders: No orders of the defined types were placed in this encounter.   Orders Placed This Encounter  Procedures   Ambulatory referral to Physical Medicine Rehab   Ambulatory referral to Orthopedic Surgery    Follow-up: Return for Left L5-S1 interlaminar epidural steroid injection.   Procedures: No procedures performed      Clinical History: EXAM: MRI LUMBAR SPINE 05/31/2024 07:21:23 AM   TECHNIQUE: Multiplanar multisequence MRI of the lumbar spine was  performed without the administration of intravenous contrast.   COMPARISON: Lumbar spine MRI 05/05/2019.   CLINICAL HISTORY: Low back pain, symptoms persist with > 6 wks treatment. LBP RAD BILAT LE; CHRONIC X YRS; NKI, NO SX, NO INJECTIONS.   FINDINGS:   BONES AND ALIGNMENT: Conventional lumbosacral anatomy is assumed with 5 non-rib-bearing, lumbar-type vertebral bodies. Normal alignment. Normal vertebral body heights. Bone marrow signal: Modic type 2 degenerative endplate marrow signal changes at T12-L1 and L5-S1.   SPINAL CORD: The conus terminates at T12-L1.   SOFT TISSUES: No paraspinal mass.   L1-L2: No significant disc herniation. No spinal canal stenosis or neural foraminal narrowing.   L2-L3: No significant disc herniation. No spinal canal stenosis or neural foraminal narrowing.   L3-L4: Mild bilateral facet arthropathy without spinal canal stenosis or neural foraminal narrowing.   L4-L5: Left subarticular disc protrusion results in severe narrowing of the left lateral recess with compression of the traversing left L5 nerve root in the subarticular zone. Left greater than right facet arthropathy at this level without significant neural foraminal narrowing.   L5-S1: Moderate disc height loss, greater along the left side. Left eccentric disc bulge resulting in mass effect on the exited left L5 nerve root in the extraforaminal zone and the traversing left S1 nerve root in the subarticular zone.   IMPRESSION: 1. Multilevel lumbar spondylosis, worse at L4-5, where disc protrusion results in severe narrowing of the left lateral recess with compression of the traversing left L5 nerve root in the subarticular zone. 2. At L5-S1, left eccentric disc bulge with mass effect on the exited left L5 nerve root in the extraforaminal zone and the traversing left S1 nerve root in the subarticular zone.   Electronically signed by: Ryan Chess MD 06/03/2024 03:27 PM EDT  RP Workstation: [id number]   He reports that he has been smoking cigarettes. He has never used smokeless tobacco. No results for input(s): HGBA1C, LABURIC in the last 8760 hours.  Objective:  VS:  HT:    WT:   BMI:     BP:   HR: bpm  TEMP: ( )  RESP:  Physical Exam Vitals and nursing note reviewed.  HENT:     Head: Normocephalic and atraumatic.     Right Ear: External ear normal.     Left Ear: External ear normal.     Nose: Nose normal.     Mouth/Throat:     Mouth: Mucous membranes are moist.  Eyes:     Extraocular Movements: Extraocular movements intact.  Cardiovascular:     Rate and Rhythm: Normal rate.     Pulses: Normal pulses.  Pulmonary:     Effort: Pulmonary effort is normal.  Abdominal:     General: Abdomen is flat. There is no distension.  Musculoskeletal:        General: Tenderness present.     Cervical back: Normal range of motion.     Comments: Patient rises from seated position to standing without difficulty. Good lumbar range of motion. No pain noted with facet  loading. 5/5 strength noted with bilateral hip flexion, knee flexion/extension, ankle dorsiflexion/plantarflexion and EHL. No clonus noted bilaterally. Pain upon palpation of right greater trochanter. Pain with internal/external rotation of right hip. Sensation intact bilaterally. Positive slump test on the right. Ambulates without aid, gait steady.     Skin:    General: Skin is warm and dry.     Capillary Refill: Capillary refill takes less than 2 seconds.  Neurological:     General: No focal deficit present.     Mental Status: He is alert and oriented to person, place, and time.  Psychiatric:        Mood and Affect: Mood normal.        Behavior: Behavior normal.     Ortho Exam  Imaging: No results found.  Past Medical/Family/Surgical/Social History: Medications & Allergies reviewed per EMR, new medications updated. Patient Active Problem List   Diagnosis Date Noted   GERD without  esophagitis 05/15/2022   Tinea pedis, left 05/09/2022   Foreign body in foot, left 05/09/2022   Protrusion of lumbar intervertebral disc 04/12/2019   Genital warts 07/02/2018   Migraine 07/02/2018   Midepigastric pain 06/14/2018   Unintentional weight loss 06/14/2018   Early satiety 06/14/2018   Food aversion 06/14/2018   Generalized anxiety disorder 07/26/2013   Smoker 07/26/2013   Past Medical History:  Diagnosis Date   Anxiety    GERD (gastroesophageal reflux disease)    Kidney stone    Medial epicondylitis of elbow, left 04/12/2019   Migraine    Panic attack    Family History  Problem Relation Age of Onset   Hypertension Mother    Migraines Mother    Hypertension Father    Migraines Brother    Migraines Maternal Grandmother    Alzheimer's disease Maternal Grandmother    Colon cancer Paternal Uncle    Esophageal cancer Neg Hx    Inflammatory bowel disease Neg Hx    Liver disease Neg Hx    Pancreatic cancer Neg Hx    Rectal cancer Neg Hx    Stomach cancer Neg Hx    Past Surgical History:  Procedure Laterality Date   APPENDECTOMY     COLONOSCOPY WITH ESOPHAGOGASTRODUODENOSCOPY (EGD)  06/30/2018   CYST REMOVAL HAND     CYST REMOVAL NECK     LACERATION REPAIR Right    wrist   Social History   Occupational History   Occupation: Solicitor  Tobacco Use   Smoking status: Every Day    Current packs/day: 1.00    Types: Cigarettes   Smokeless tobacco: Never  Vaping Use   Vaping status: Former  Substance and Sexual Activity   Alcohol use: Yes    Comment: once a month   Drug use: Yes    Types: Marijuana    Comment: last had 3-4 weeks ago   Sexual activity: Yes    Partners: Female

## 2024-06-20 ENCOUNTER — Encounter: Admitting: Physician Assistant

## 2024-06-27 ENCOUNTER — Encounter: Payer: Self-pay | Admitting: Radiology

## 2024-07-18 ENCOUNTER — Ambulatory Visit: Admitting: Physical Medicine and Rehabilitation

## 2024-07-18 ENCOUNTER — Other Ambulatory Visit: Payer: Self-pay

## 2024-07-18 VITALS — BP 149/99 | HR 78

## 2024-07-18 DIAGNOSIS — M5416 Radiculopathy, lumbar region: Secondary | ICD-10-CM | POA: Diagnosis not present

## 2024-07-18 MED ORDER — METHYLPREDNISOLONE ACETATE 40 MG/ML IJ SUSP
40.0000 mg | Freq: Once | INTRAMUSCULAR | Status: AC
  Administered 2024-07-18: 40 mg

## 2024-07-18 NOTE — Progress Notes (Signed)
 Pain Scale   Average Pain 3 Patient advising he has chronic lower back pain radiating to left side pain is constant        +Driver, -BT, -Dye Allergies.

## 2024-07-24 NOTE — Procedures (Signed)
 Lumbar Epidural Steroid Injection - Interlaminar Approach with Fluoroscopic Guidance  Patient: Ralph Lang      Date of Birth: [birth year] MRN: [id number] PCP: Ozell Heron HERO, MD      Visit Date: 07/18/2024   Universal Protocol:     Consent Given By: the patient  Position: PRONE  Additional Comments: Vital signs were monitored before and after the procedure. Patient was prepped and draped in the usual sterile fashion. The correct patient, procedure, and site was verified.   Injection Procedure Details:   Procedure diagnoses: Lumbar radiculopathy [M54.16]   Meds Administered:  Meds ordered this encounter  Medications   methylPREDNISolone  acetate (DEPO-MEDROL ) injection 40 mg     Laterality: Left  Location/Site:  L5-S1  Needle: 3.5 in., 20 ga. Tuohy  Needle Placement: Paramedian epidural  Findings:   -Comments: Excellent flow of contrast into the epidural space.  Procedure Details: Using a paramedian approach from the side mentioned above, the region overlying the inferior lamina was localized under fluoroscopic visualization and the soft tissues overlying this structure were infiltrated with 4 ml. of 1% Lidocaine  without Epinephrine. The Tuohy needle was inserted into the epidural space using a paramedian approach.   The epidural space was localized using loss of resistance along with counter oblique bi-planar fluoroscopic views.  After negative aspirate for air, blood, and CSF, a 2 ml. volume of Isovue -250 was injected into the epidural space and the flow of contrast was observed. Radiographs were obtained for documentation purposes.    The injectate was administered into the level noted above.   Additional Comments:  The patient tolerated the procedure well Dressing: 2 x 2 sterile gauze and Band-Aid    Post-procedure details: Patient was observed during the procedure. Post-procedure instructions were reviewed.  Patient left the clinic in stable  condition.

## 2024-07-24 NOTE — Progress Notes (Signed)
 Ralph Lang - 48 y.o. male MRN [id number]  Date of birth: [birth year]  Office Visit Note: Visit Date: 07/18/2024 PCP: Ozell Heron HERO, MD Referred by: Ozell Heron HERO, MD  Subjective: Chief Complaint  Patient presents with   Lower Back - Pain   HPI:  Ralph Lang is a 47 y.o. male who comes in today at the request of Duwaine Pouch, FNP for planned Left L5-S1 Lumbar Interlaminar epidural steroid injection with fluoroscopic guidance.  The patient has failed conservative care including home exercise, medications, time and activity modification.  This injection will be diagnostic and hopefully therapeutic.  Please see requesting physician notes for further details and justification.   ROS Otherwise per HPI.  Assessment & Plan: Visit Diagnoses:    ICD-10-CM   1. Lumbar radiculopathy  M54.16 XR C-ARM NO REPORT    Epidural Steroid injection    methylPREDNISolone  acetate (DEPO-MEDROL ) injection 40 mg      Plan: No additional findings.   Meds & Orders:  Meds ordered this encounter  Medications   methylPREDNISolone  acetate (DEPO-MEDROL ) injection 40 mg    Orders Placed This Encounter  Procedures   XR C-ARM NO REPORT   Epidural Steroid injection    Follow-up: Return for visit to requesting provider as needed.   Procedures: No procedures performed  Lumbar Epidural Steroid Injection - Interlaminar Approach with Fluoroscopic Guidance  Patient: Ralph Lang      Date of Birth: 11/11/1975 MRN: [id number] PCP: Ozell Heron HERO, MD      Visit Date: 07/18/2024   Universal Protocol:     Consent Given By: the patient  Position: PRONE  Additional Comments: Vital signs were monitored before and after the procedure. Patient was prepped and draped in the usual sterile fashion. The correct patient, procedure, and site was verified.   Injection Procedure Details:   Procedure diagnoses: Lumbar radiculopathy [M54.16]   Meds Administered:  Meds ordered  this encounter  Medications   methylPREDNISolone  acetate (DEPO-MEDROL ) injection 40 mg     Laterality: Left  Location/Site:  L5-S1  Needle: 3.5 in., 20 ga. Tuohy  Needle Placement: Paramedian epidural  Findings:   -Comments: Excellent flow of contrast into the epidural space.  Procedure Details: Using a paramedian approach from the side mentioned above, the region overlying the inferior lamina was localized under fluoroscopic visualization and the soft tissues overlying this structure were infiltrated with 4 ml. of 1% Lidocaine  without Epinephrine. The Tuohy needle was inserted into the epidural space using a paramedian approach.   The epidural space was localized using loss of resistance along with counter oblique bi-planar fluoroscopic views.  After negative aspirate for air, blood, and CSF, a 2 ml. volume of Isovue -250 was injected into the epidural space and the flow of contrast was observed. Radiographs were obtained for documentation purposes.    The injectate was administered into the level noted above.   Additional Comments:  The patient tolerated the procedure well Dressing: 2 x 2 sterile gauze and Band-Aid    Post-procedure details: Patient was observed during the procedure. Post-procedure instructions were reviewed.  Patient left the clinic in stable condition.   Clinical History: EXAM: MRI LUMBAR SPINE 05/31/2024 07:21:23 AM   TECHNIQUE: Multiplanar multisequence MRI of the lumbar spine was performed without the administration of intravenous contrast.   COMPARISON: Lumbar spine MRI 05/05/2019.   CLINICAL HISTORY: Low back pain, symptoms persist with > 6 wks treatment. LBP RAD BILAT LE; CHRONIC X YRS; NKI, NO SX, NO INJECTIONS.  FINDINGS:   BONES AND ALIGNMENT: Conventional lumbosacral anatomy is assumed with 5 non-rib-bearing, lumbar-type vertebral bodies. Normal alignment. Normal vertebral body heights. Bone marrow signal: Modic type 2  degenerative endplate marrow signal changes at T12-L1 and L5-S1.   SPINAL CORD: The conus terminates at T12-L1.   SOFT TISSUES: No paraspinal mass.   L1-L2: No significant disc herniation. No spinal canal stenosis or neural foraminal narrowing.   L2-L3: No significant disc herniation. No spinal canal stenosis or neural foraminal narrowing.   L3-L4: Mild bilateral facet arthropathy without spinal canal stenosis or neural foraminal narrowing.   L4-L5: Left subarticular disc protrusion results in severe narrowing of the left lateral recess with compression of the traversing left L5 nerve root in the subarticular zone. Left greater than right facet arthropathy at this level without significant neural foraminal narrowing.   L5-S1: Moderate disc height loss, greater along the left side. Left eccentric disc bulge resulting in mass effect on the exited left L5 nerve root in the extraforaminal zone and the traversing left S1 nerve root in the subarticular zone.   IMPRESSION: 1. Multilevel lumbar spondylosis, worse at L4-5, where disc protrusion results in severe narrowing of the left lateral recess with compression of the traversing left L5 nerve root in the subarticular zone. 2. At L5-S1, left eccentric disc bulge with mass effect on the exited left L5 nerve root in the extraforaminal zone and the traversing left S1 nerve root in the subarticular zone.   Electronically signed by: Ryan Chess MD 06/03/2024 03:27 PM EDT RP Workstation: [id number]     Objective:  VS:  HT:    WT:   BMI:     BP:(!) 149/99  HR:78bpm  TEMP: ( )  RESP:  Physical Exam Vitals and nursing note reviewed.  Constitutional:      General: He is not in acute distress.    Appearance: Normal appearance. He is not ill-appearing.  HENT:     Head: Normocephalic and atraumatic.     Right Ear: External ear normal.     Left Ear: External ear normal.     Nose: No congestion.  Eyes:     Extraocular  Movements: Extraocular movements intact.  Cardiovascular:     Rate and Rhythm: Normal rate.     Pulses: Normal pulses.  Pulmonary:     Effort: Pulmonary effort is normal. No respiratory distress.  Abdominal:     General: There is no distension.     Palpations: Abdomen is soft.  Musculoskeletal:        General: No tenderness or signs of injury.     Cervical back: Neck supple.     Right lower leg: No edema.     Left lower leg: No edema.     Comments: Patient has good distal strength without clonus.  Skin:    Findings: No erythema or rash.  Neurological:     General: No focal deficit present.     Mental Status: He is alert and oriented to person, place, and time.     Sensory: No sensory deficit.     Motor: No weakness or abnormal muscle tone.     Coordination: Coordination normal.  Psychiatric:        Mood and Affect: Mood normal.        Behavior: Behavior normal.      Imaging: No results found.
